# Patient Record
Sex: Male | Born: 1945 | Race: Black or African American | Hispanic: No | Marital: Single | Smoking: Current every day smoker
Health system: Southern US, Community
[De-identification: ages and names within clinical notes are randomized; demographics above are authoritative.]

## PROBLEM LIST (undated history)

## (undated) DIAGNOSIS — Z923 Personal history of irradiation: Secondary | ICD-10-CM

## (undated) DIAGNOSIS — C801 Malignant (primary) neoplasm, unspecified: Secondary | ICD-10-CM

## (undated) DIAGNOSIS — F1010 Alcohol abuse, uncomplicated: Secondary | ICD-10-CM

## (undated) DIAGNOSIS — K219 Gastro-esophageal reflux disease without esophagitis: Secondary | ICD-10-CM

## (undated) DIAGNOSIS — I10 Essential (primary) hypertension: Secondary | ICD-10-CM

## (undated) DIAGNOSIS — R630 Anorexia: Secondary | ICD-10-CM

## (undated) DIAGNOSIS — C3491 Malignant neoplasm of unspecified part of right bronchus or lung: Secondary | ICD-10-CM

## (undated) DIAGNOSIS — I219 Acute myocardial infarction, unspecified: Secondary | ICD-10-CM

## (undated) DIAGNOSIS — R42 Dizziness and giddiness: Secondary | ICD-10-CM

## (undated) DIAGNOSIS — Z9221 Personal history of antineoplastic chemotherapy: Secondary | ICD-10-CM

## (undated) HISTORY — PX: GANGLION CYST EXCISION: SHX1691

## (undated) HISTORY — DX: Personal history of antineoplastic chemotherapy: Z92.21

## (undated) HISTORY — PX: OTHER SURGICAL HISTORY: SHX169

## (undated) HISTORY — DX: Dizziness and giddiness: R42

## (undated) HISTORY — PX: APPENDECTOMY: SHX54

## (undated) HISTORY — DX: Personal history of irradiation: Z92.3

## (undated) HISTORY — PX: HERNIA REPAIR: SHX51

## (undated) HISTORY — DX: Acute myocardial infarction, unspecified: I21.9

## (undated) HISTORY — DX: Malignant neoplasm of unspecified part of right bronchus or lung: C34.91

## (undated) HISTORY — DX: Anorexia: R63.0

---

## 1980-05-08 DIAGNOSIS — I219 Acute myocardial infarction, unspecified: Secondary | ICD-10-CM

## 1980-05-08 HISTORY — DX: Acute myocardial infarction, unspecified: I21.9

## 2006-12-18 ENCOUNTER — Inpatient Hospital Stay: Payer: Self-pay | Admitting: Internal Medicine

## 2006-12-18 ENCOUNTER — Other Ambulatory Visit: Payer: Self-pay

## 2011-01-16 ENCOUNTER — Ambulatory Visit: Payer: Self-pay | Admitting: Internal Medicine

## 2011-09-29 DIAGNOSIS — C801 Malignant (primary) neoplasm, unspecified: Secondary | ICD-10-CM

## 2011-09-29 HISTORY — DX: Malignant (primary) neoplasm, unspecified: C80.1

## 2011-11-17 ENCOUNTER — Ambulatory Visit: Payer: Self-pay | Admitting: Internal Medicine

## 2011-11-17 LAB — COMPREHENSIVE METABOLIC PANEL
Albumin: 3.6 g/dL (ref 3.4–5.0)
Alkaline Phosphatase: 81 U/L (ref 50–136)
Anion Gap: 6 — ABNORMAL LOW (ref 7–16)
Calcium, Total: 9.3 mg/dL (ref 8.5–10.1)
Co2: 32 mmol/L (ref 21–32)
Creatinine: 1.21 mg/dL (ref 0.60–1.30)
EGFR (Non-African Amer.): >60
Glucose: 135 mg/dL — ABNORMAL HIGH (ref 65–99)
Osmolality: 277 (ref 275–301)
SGOT(AST): 18 U/L (ref 15–37)
SGPT (ALT): 16 U/L

## 2011-11-17 LAB — CBC CANCER CENTER
Basophil %: 1 %
Eosinophil %: 1.6 %
HCT: 41.9 % (ref 40.0–52.0)
HGB: 14.1 g/dL (ref 13.0–18.0)
Lymphocyte %: 23.6 %
Monocyte #: 0.6 x10 3/mm (ref 0.2–1.0)
Neutrophil #: 4 x10 3/mm (ref 1.4–6.5)
Neutrophil %: 64.1 %
Platelet: 206 x10 3/mm (ref 150–440)
RDW: 14.2 % (ref 11.5–14.5)
WBC: 6.2 x10 3/mm (ref 3.8–10.6)

## 2011-11-17 LAB — APTT: Activated PTT: 33.2 secs (ref 23.6–35.9)

## 2011-11-17 LAB — PROTIME-INR
INR: 0.9
Prothrombin Time: 13 secs (ref 11.5–14.7)

## 2011-11-20 ENCOUNTER — Ambulatory Visit: Payer: Self-pay | Admitting: Internal Medicine

## 2011-11-22 LAB — CBC CANCER CENTER
Basophil #: 0.1 x10 3/mm (ref 0.0–0.1)
Basophil %: 3.5 %
Eosinophil #: 0.2 x10 3/mm (ref 0.0–0.7)
HGB: 13.4 g/dL (ref 13.0–18.0)
Lymphocyte %: 33.6 %
MCHC: 32.9 g/dL (ref 32.0–36.0)
MCV: 104 fL — ABNORMAL HIGH (ref 80–100)
Monocyte %: 13.2 %
Neutrophil %: 45.1 %

## 2011-11-22 LAB — BASIC METABOLIC PANEL
BUN: 7 mg/dL (ref 7–18)
Calcium, Total: 8.9 mg/dL (ref 8.5–10.1)
Creatinine: 1.15 mg/dL (ref 0.60–1.30)
EGFR (Non-African Amer.): >60
Glucose: 79 mg/dL (ref 65–99)
Osmolality: 282 (ref 275–301)

## 2011-11-22 LAB — MAGNESIUM: Magnesium: 1.9 mg/dL

## 2011-11-23 ENCOUNTER — Observation Stay: Payer: Self-pay | Admitting: Internal Medicine

## 2011-11-24 ENCOUNTER — Inpatient Hospital Stay: Payer: Self-pay | Admitting: Internal Medicine

## 2011-11-25 LAB — BASIC METABOLIC PANEL
Anion Gap: 10 (ref 7–16)
Calcium, Total: 8.2 mg/dL — ABNORMAL LOW (ref 8.5–10.1)
Co2: 23 mmol/L (ref 21–32)
EGFR (African American): >60
Glucose: 147 mg/dL — ABNORMAL HIGH (ref 65–99)
Potassium: 4.2 mmol/L (ref 3.5–5.1)
Sodium: 137 mmol/L (ref 136–145)

## 2011-11-28 LAB — CBC WITH DIFFERENTIAL/PLATELET
Basophil #: 0 10*3/uL (ref 0.0–0.1)
Basophil %: 0.4 %
Eosinophil %: 0.8 %
HGB: 12.8 g/dL — ABNORMAL LOW (ref 13.0–18.0)
Lymphocyte #: 0.9 10*3/uL — ABNORMAL LOW (ref 1.0–3.6)
MCH: 34.1 pg — ABNORMAL HIGH (ref 26.0–34.0)
MCHC: 32.9 g/dL (ref 32.0–36.0)
MCV: 104 fL — ABNORMAL HIGH (ref 80–100)
Monocyte #: 0.1 x10 3/mm — ABNORMAL LOW (ref 0.2–1.0)
RBC: 3.74 10*6/uL — ABNORMAL LOW (ref 4.40–5.90)

## 2011-11-28 LAB — HEPATIC FUNCTION PANEL A (ARMC)
Albumin: 2.6 g/dL — ABNORMAL LOW (ref 3.4–5.0)
Bilirubin, Direct: 0.1 mg/dL (ref 0.00–0.20)
Bilirubin,Total: 0.5 mg/dL (ref 0.2–1.0)
SGOT(AST): 11 U/L — ABNORMAL LOW (ref 15–37)

## 2011-11-28 LAB — BASIC METABOLIC PANEL
Anion Gap: 10 (ref 7–16)
Calcium, Total: 7.9 mg/dL — ABNORMAL LOW (ref 8.5–10.1)
Chloride: 99 mmol/L (ref 98–107)
Co2: 29 mmol/L (ref 21–32)
EGFR (Non-African Amer.): >60
Osmolality: 276 (ref 275–301)

## 2011-12-01 LAB — CBC WITH DIFFERENTIAL/PLATELET
Basophil #: 0 x10 3/mm 3
Basophil %: 1.3 %
Eosinophil #: 0 x10 3/mm 3
Eosinophil %: 2.9 %
HCT: 35.3 % — ABNORMAL LOW
HGB: 12.4 g/dL — ABNORMAL LOW
Lymphocyte %: 82.4 %
Lymphs Abs: 0.4 x10 3/mm 3 — ABNORMAL LOW
MCH: 35.6 pg — ABNORMAL HIGH
MCHC: 35.2 g/dL
MCV: 101 fL — ABNORMAL HIGH
Monocyte #: 0 "x10 3/mm " — ABNORMAL LOW
Monocyte %: 3.6 %
Neutrophil #: 0.1 x10 3/mm 3 — ABNORMAL LOW
Neutrophil %: 9.8 %
Platelet: 76 x10 3/mm 3 — ABNORMAL LOW
RBC: 3.49 x10 6/mm 3 — ABNORMAL LOW
RDW: 13.8 %
WBC: 0.5 x10 3/mm 3 — CL

## 2011-12-01 LAB — BASIC METABOLIC PANEL
Anion Gap: 7 (ref 7–16)
Calcium, Total: 8.1 mg/dL — ABNORMAL LOW (ref 8.5–10.1)
Chloride: 99 mmol/L (ref 98–107)
Co2: 29 mmol/L (ref 21–32)
EGFR (African American): >60
EGFR (Non-African Amer.): >60
Glucose: 97 mg/dL (ref 65–99)
Osmolality: 270 (ref 275–301)
Sodium: 135 mmol/L — ABNORMAL LOW (ref 136–145)

## 2011-12-02 LAB — CBC WITH DIFFERENTIAL/PLATELET
Eosinophil #: 0 10*3/uL (ref 0.0–0.7)
Eosinophil %: 2.4 %
HCT: 35.5 % — ABNORMAL LOW (ref 40.0–52.0)
HGB: 12.5 g/dL — ABNORMAL LOW (ref 13.0–18.0)
Lymphocyte #: 0.5 10*3/uL — ABNORMAL LOW (ref 1.0–3.6)
Lymphocyte %: 73.7 %
MCH: 35.7 pg — ABNORMAL HIGH (ref 26.0–34.0)
MCV: 101 fL — ABNORMAL HIGH (ref 80–100)
Monocyte #: 0.1 x10 3/mm — ABNORMAL LOW (ref 0.2–1.0)
Platelet: 84 10*3/uL — ABNORMAL LOW (ref 150–440)
RBC: 3.52 10*6/uL — ABNORMAL LOW (ref 4.40–5.90)
RDW: 13.5 % (ref 11.5–14.5)
WBC: 0.7 10*3/uL — CL (ref 3.8–10.6)

## 2011-12-02 LAB — POTASSIUM: Potassium: 3.4 mmol/L — ABNORMAL LOW (ref 3.5–5.1)

## 2011-12-03 LAB — BASIC METABOLIC PANEL
Calcium, Total: 8 mg/dL — ABNORMAL LOW (ref 8.5–10.1)
Co2: 28 mmol/L (ref 21–32)
Creatinine: 1.65 mg/dL — ABNORMAL HIGH (ref 0.60–1.30)
EGFR (African American): 49 — ABNORMAL LOW
EGFR (Non-African Amer.): 55 — ABNORMAL LOW
EGFR (Non-African Amer.): 42 — ABNORMAL LOW
Glucose: 108 mg/dL — ABNORMAL HIGH (ref 65–99)
Glucose: 142 mg/dL — ABNORMAL HIGH (ref 65–99)
Osmolality: 267 (ref 275–301)
Osmolality: 268 (ref 275–301)
Potassium: 3 mmol/L — ABNORMAL LOW (ref 3.5–5.1)
Potassium: 3 mmol/L — ABNORMAL LOW (ref 3.5–5.1)
Sodium: 132 mmol/L — ABNORMAL LOW (ref 136–145)
Sodium: 131 mmol/L — ABNORMAL LOW (ref 136–145)

## 2011-12-03 LAB — CBC WITH DIFFERENTIAL/PLATELET
Basophil #: 0 10*3/uL (ref 0.0–0.1)
Basophil %: 0.8 %
Eosinophil %: 1.6 %
HGB: 12.2 g/dL — ABNORMAL LOW (ref 13.0–18.0)
Monocyte #: 0.4 x10 3/mm (ref 0.2–1.0)
Monocyte %: 39.5 %
Neutrophil %: 4.9 %
Platelet: 86 10*3/uL — ABNORMAL LOW (ref 150–440)
RBC: 3.41 10*6/uL — ABNORMAL LOW (ref 4.40–5.90)
RDW: 13.7 % (ref 11.5–14.5)
WBC: 1 10*3/uL — CL (ref 3.8–10.6)

## 2011-12-04 LAB — CREATININE, SERUM
EGFR (African American): 43 — ABNORMAL LOW
EGFR (Non-African Amer.): 37 — ABNORMAL LOW

## 2011-12-04 LAB — CBC WITH DIFFERENTIAL/PLATELET
Basophil #: 0 10*3/uL (ref 0.0–0.1)
Basophil %: 0.3 %
Eosinophil %: 0.7 %
Lymphocyte #: 0.6 10*3/uL — ABNORMAL LOW (ref 1.0–3.6)
Lymphocyte %: 24.8 %
MCH: 35.7 pg — ABNORMAL HIGH (ref 26.0–34.0)
MCHC: 36.1 g/dL — ABNORMAL HIGH (ref 32.0–36.0)
MCV: 99 fL (ref 80–100)
Neutrophil #: 1 10*3/uL — ABNORMAL LOW (ref 1.4–6.5)
Platelet: 106 10*3/uL — ABNORMAL LOW (ref 150–440)
RDW: 13.6 % (ref 11.5–14.5)

## 2011-12-04 LAB — BASIC METABOLIC PANEL
BUN: 25 mg/dL — ABNORMAL HIGH (ref 7–18)
Creatinine: 1.81 mg/dL — ABNORMAL HIGH (ref 0.60–1.30)
EGFR (African American): 44 — ABNORMAL LOW
EGFR (Non-African Amer.): 38 — ABNORMAL LOW
Glucose: 119 mg/dL — ABNORMAL HIGH (ref 65–99)
Osmolality: 266 (ref 275–301)
Potassium: 3.2 mmol/L — ABNORMAL LOW (ref 3.5–5.1)
Sodium: 130 mmol/L — ABNORMAL LOW (ref 136–145)

## 2011-12-04 LAB — VANCOMYCIN, TROUGH: Vancomycin, Trough: 17 ug/mL (ref 10–20)

## 2011-12-05 LAB — CBC WITH DIFFERENTIAL/PLATELET
Basophil #: 0 10*3/uL (ref 0.0–0.1)
Eosinophil #: 0 10*3/uL (ref 0.0–0.7)
Eosinophil %: 0.1 %
HCT: 32 % — ABNORMAL LOW (ref 40.0–52.0)
HGB: 11.4 g/dL — ABNORMAL LOW (ref 13.0–18.0)
Lymphocyte #: 0.7 10*3/uL — ABNORMAL LOW (ref 1.0–3.6)
Lymphocyte %: 12.9 %
MCHC: 35.6 g/dL (ref 32.0–36.0)
MCV: 100 fL (ref 80–100)
Monocyte %: 8.9 %
Neutrophil #: 4.2 10*3/uL (ref 1.4–6.5)
Neutrophil %: 77.9 %
Platelet: 114 10*3/uL — ABNORMAL LOW (ref 150–440)
RBC: 3.2 10*6/uL — ABNORMAL LOW (ref 4.40–5.90)
RDW: 13.8 % (ref 11.5–14.5)
WBC: 5.3 10*3/uL (ref 3.8–10.6)

## 2011-12-05 LAB — MAGNESIUM: Magnesium: 2 mg/dL

## 2011-12-05 LAB — CREATININE, SERUM
Creatinine: 2.15 mg/dL — ABNORMAL HIGH (ref 0.60–1.30)
EGFR (African American): 36 — ABNORMAL LOW

## 2011-12-06 LAB — BASIC METABOLIC PANEL
Anion Gap: 9 (ref 7–16)
Calcium, Total: 7.9 mg/dL — ABNORMAL LOW (ref 8.5–10.1)
Chloride: 99 mmol/L (ref 98–107)
Co2: 29 mmol/L (ref 21–32)
EGFR (African American): 33 — ABNORMAL LOW
EGFR (Non-African Amer.): 29 — ABNORMAL LOW
Glucose: 137 mg/dL — ABNORMAL HIGH (ref 65–99)
Sodium: 137 mmol/L (ref 136–145)

## 2011-12-06 LAB — CBC WITH DIFFERENTIAL/PLATELET
Basophil #: 0.2 10*3/uL — ABNORMAL HIGH (ref 0.0–0.1)
Basophil %: 0.2 %
Lymphocyte %: 4 %
MCH: 35.2 pg — ABNORMAL HIGH (ref 26.0–34.0)
Monocyte %: 8.9 %
Neutrophil %: 86.9 %
Platelet: 142 10*3/uL — ABNORMAL LOW (ref 150–440)
RDW: 14 % (ref 11.5–14.5)
WBC: 14.4 10*3/uL — ABNORMAL HIGH (ref 3.8–10.6)

## 2011-12-06 LAB — URINE CULTURE

## 2011-12-07 ENCOUNTER — Ambulatory Visit: Payer: Self-pay | Admitting: Internal Medicine

## 2011-12-07 LAB — CULTURE, BLOOD (SINGLE)

## 2011-12-15 LAB — COMPREHENSIVE METABOLIC PANEL
Alkaline Phosphatase: 93 U/L (ref 50–136)
BUN: 21 mg/dL — ABNORMAL HIGH (ref 7–18)
Bilirubin,Total: 0.2 mg/dL (ref 0.2–1.0)
Calcium, Total: 8.6 mg/dL (ref 8.5–10.1)
Chloride: 103 mmol/L (ref 98–107)
Creatinine: 1.57 mg/dL — ABNORMAL HIGH (ref 0.60–1.30)
EGFR (African American): 52 — ABNORMAL LOW
Glucose: 87 mg/dL (ref 65–99)
Osmolality: 285 (ref 275–301)
Potassium: 3.5 mmol/L (ref 3.5–5.1)
SGPT (ALT): 10 U/L — ABNORMAL LOW (ref 12–78)
Sodium: 142 mmol/L (ref 136–145)
Total Protein: 7 g/dL (ref 6.4–8.2)

## 2011-12-15 LAB — CBC CANCER CENTER
Basophil %: 0.4 %
Eosinophil #: 0.1 x10 3/mm (ref 0.0–0.7)
HCT: 32.8 % — ABNORMAL LOW (ref 40.0–52.0)
HGB: 11.1 g/dL — ABNORMAL LOW (ref 13.0–18.0)
Lymphocyte #: 1.6 x10 3/mm (ref 1.0–3.6)
Lymphocyte %: 11.4 %
MCH: 34.9 pg — ABNORMAL HIGH (ref 26.0–34.0)
MCHC: 33.8 g/dL (ref 32.0–36.0)
MCV: 103 fL — ABNORMAL HIGH (ref 80–100)
Monocyte #: 1.2 x10 3/mm — ABNORMAL HIGH (ref 0.2–1.0)
Neutrophil #: 11.4 x10 3/mm — ABNORMAL HIGH (ref 1.4–6.5)
Platelet: 332 x10 3/mm (ref 150–440)
WBC: 14.3 x10 3/mm — ABNORMAL HIGH (ref 3.8–10.6)

## 2011-12-21 ENCOUNTER — Ambulatory Visit: Payer: Self-pay | Admitting: Vascular Surgery

## 2011-12-22 LAB — COMPREHENSIVE METABOLIC PANEL
Alkaline Phosphatase: 100 U/L (ref 50–136)
Anion Gap: 6 — ABNORMAL LOW (ref 7–16)
Calcium, Total: 8.8 mg/dL (ref 8.5–10.1)
Chloride: 100 mmol/L (ref 98–107)
Co2: 33 mmol/L — ABNORMAL HIGH (ref 21–32)
EGFR (African American): >60
Osmolality: 280 (ref 275–301)
SGPT (ALT): 13 U/L (ref 12–78)
Sodium: 139 mmol/L (ref 136–145)

## 2011-12-22 LAB — CBC CANCER CENTER
Basophil %: 0.9 %
Eosinophil %: 2 %
HCT: 32.2 % — ABNORMAL LOW (ref 40.0–52.0)
MCHC: 33.1 g/dL (ref 32.0–36.0)
Monocyte %: 12.6 %
Neutrophil #: 6.8 x10 3/mm — ABNORMAL HIGH (ref 1.4–6.5)
Neutrophil %: 69.9 %
Platelet: 243 x10 3/mm (ref 150–440)
RBC: 3.15 10*6/uL — ABNORMAL LOW (ref 4.40–5.90)

## 2011-12-26 LAB — BASIC METABOLIC PANEL
Anion Gap: 5 — ABNORMAL LOW (ref 7–16)
BUN: 28 mg/dL — ABNORMAL HIGH (ref 7–18)
Calcium, Total: 8.9 mg/dL (ref 8.5–10.1)
Chloride: 100 mmol/L (ref 98–107)
Creatinine: 1.2 mg/dL (ref 0.60–1.30)
EGFR (Non-African Amer.): >60
Glucose: 97 mg/dL (ref 65–99)
Osmolality: 285 (ref 275–301)
Sodium: 140 mmol/L (ref 136–145)

## 2012-01-01 LAB — BASIC METABOLIC PANEL
Anion Gap: 7 (ref 7–16)
Calcium, Total: 9.4 mg/dL (ref 8.5–10.1)
Chloride: 102 mmol/L (ref 98–107)
Co2: 32 mmol/L (ref 21–32)
Creatinine: 1.3 mg/dL (ref 0.60–1.30)
EGFR (African American): >60
EGFR (Non-African Amer.): 56 — ABNORMAL LOW
Glucose: 88 mg/dL (ref 65–99)
Osmolality: 284 (ref 275–301)
Sodium: 141 mmol/L (ref 136–145)

## 2012-01-01 LAB — CBC CANCER CENTER
Basophil #: 0.1 x10 3/mm (ref 0.0–0.1)
Eosinophil #: 0.1 x10 3/mm (ref 0.0–0.7)
HGB: 11 g/dL — ABNORMAL LOW (ref 13.0–18.0)
Lymphocyte %: 12.2 %
MCH: 33.3 pg (ref 26.0–34.0)
MCHC: 32.9 g/dL (ref 32.0–36.0)
MCV: 101 fL — ABNORMAL HIGH (ref 80–100)
Monocyte #: 1.4 x10 3/mm — ABNORMAL HIGH (ref 0.2–1.0)
Neutrophil %: 76.1 %
Platelet: 202 x10 3/mm (ref 150–440)
RDW: 14.1 % (ref 11.5–14.5)

## 2012-01-07 ENCOUNTER — Ambulatory Visit: Payer: Self-pay | Admitting: Internal Medicine

## 2012-01-10 LAB — CBC CANCER CENTER
Eosinophil %: 0.5 %
Lymphocyte %: 16.6 %
Monocyte %: 5.4 %
Neutrophil %: 77.1 %
Platelet: 116 x10 3/mm — ABNORMAL LOW (ref 150–440)

## 2012-01-10 LAB — HEPATIC FUNCTION PANEL A (ARMC)
Albumin: 3.6 g/dL (ref 3.4–5.0)
Alkaline Phosphatase: 111 U/L (ref 50–136)
Bilirubin,Total: 0.4 mg/dL (ref 0.2–1.0)
SGOT(AST): 30 U/L (ref 15–37)
SGPT (ALT): 31 U/L (ref 12–78)

## 2012-01-10 LAB — BASIC METABOLIC PANEL
Calcium, Total: 9.3 mg/dL (ref 8.5–10.1)
Co2: 31 mmol/L (ref 21–32)
EGFR (Non-African Amer.): 47 — ABNORMAL LOW
Glucose: 81 mg/dL (ref 65–99)

## 2012-01-30 LAB — CBC CANCER CENTER
Basophil #: 0.1 x10 3/mm (ref 0.0–0.1)
Eosinophil #: 0.1 x10 3/mm (ref 0.0–0.7)
Eosinophil %: 1.9 %
HGB: 11.6 g/dL — ABNORMAL LOW (ref 13.0–18.0)
Lymphocyte #: 1.8 x10 3/mm (ref 1.0–3.6)
MCH: 35.5 pg — ABNORMAL HIGH (ref 26.0–34.0)
MCHC: 33.4 g/dL (ref 32.0–36.0)
Monocyte #: 0.9 x10 3/mm (ref 0.2–1.0)
Monocyte %: 12.9 %
Neutrophil #: 3.8 x10 3/mm (ref 1.4–6.5)
Neutrophil %: 57 %
Platelet: 225 x10 3/mm (ref 150–440)
RBC: 3.28 10*6/uL — ABNORMAL LOW (ref 4.40–5.90)
RDW: 19.7 % — ABNORMAL HIGH (ref 11.5–14.5)
WBC: 6.6 x10 3/mm (ref 3.8–10.6)

## 2012-01-30 LAB — BASIC METABOLIC PANEL
Anion Gap: 7 (ref 7–16)
BUN: 12 mg/dL (ref 7–18)
Calcium, Total: 9.6 mg/dL (ref 8.5–10.1)
Chloride: 101 mmol/L (ref 98–107)
Co2: 34 mmol/L — ABNORMAL HIGH (ref 21–32)
Creatinine: 1.29 mg/dL (ref 0.60–1.30)
EGFR (African American): >60
Osmolality: 282 (ref 275–301)
Potassium: 3.3 mmol/L — ABNORMAL LOW (ref 3.5–5.1)

## 2012-02-06 ENCOUNTER — Ambulatory Visit: Payer: Self-pay | Admitting: Internal Medicine

## 2012-02-06 LAB — BASIC METABOLIC PANEL
Anion Gap: 10 (ref 7–16)
Calcium, Total: 9 mg/dL (ref 8.5–10.1)
Chloride: 105 mmol/L (ref 98–107)
Co2: 27 mmol/L (ref 21–32)
Osmolality: 283 (ref 275–301)
Potassium: 3.3 mmol/L — ABNORMAL LOW (ref 3.5–5.1)

## 2012-02-06 LAB — CBC CANCER CENTER
Basophil #: 0.1 x10 3/mm (ref 0.0–0.1)
Eosinophil #: 0.1 x10 3/mm (ref 0.0–0.7)
HGB: 11.3 g/dL — ABNORMAL LOW (ref 13.0–18.0)
Lymphocyte #: 1.1 x10 3/mm (ref 1.0–3.6)
Lymphocyte %: 19.2 %
MCH: 35 pg — ABNORMAL HIGH (ref 26.0–34.0)
MCHC: 32.5 g/dL (ref 32.0–36.0)
Neutrophil #: 3.7 x10 3/mm (ref 1.4–6.5)
Neutrophil %: 61.6 %
Platelet: 222 x10 3/mm (ref 150–440)
WBC: 5.9 x10 3/mm (ref 3.8–10.6)

## 2012-02-06 LAB — MAGNESIUM: Magnesium: 2 mg/dL

## 2012-02-13 LAB — CBC CANCER CENTER
Basophil #: 0.1 x10 3/mm (ref 0.0–0.1)
HCT: 33.6 % — ABNORMAL LOW (ref 40.0–52.0)
Lymphocyte #: 0.8 x10 3/mm — ABNORMAL LOW (ref 1.0–3.6)
MCH: 36.1 pg — ABNORMAL HIGH (ref 26.0–34.0)
MCHC: 33.3 g/dL (ref 32.0–36.0)
MCV: 108 fL — ABNORMAL HIGH (ref 80–100)
Monocyte #: 0.5 x10 3/mm (ref 0.2–1.0)
Monocyte %: 9.5 %
Neutrophil #: 3.4 x10 3/mm (ref 1.4–6.5)
Platelet: 169 x10 3/mm (ref 150–440)
RDW: 19.4 % — ABNORMAL HIGH (ref 11.5–14.5)
WBC: 5 x10 3/mm (ref 3.8–10.6)

## 2012-02-13 LAB — BASIC METABOLIC PANEL
Anion Gap: 10 (ref 7–16)
BUN: 10 mg/dL (ref 7–18)
Calcium, Total: 8.6 mg/dL (ref 8.5–10.1)
Co2: 27 mmol/L (ref 21–32)
Creatinine: 1 mg/dL (ref 0.60–1.30)
EGFR (African American): >60
EGFR (Non-African Amer.): >60
Glucose: 90 mg/dL (ref 65–99)
Sodium: 143 mmol/L (ref 136–145)

## 2012-02-13 LAB — MAGNESIUM: Magnesium: 1.9 mg/dL

## 2012-02-20 LAB — BASIC METABOLIC PANEL
Anion Gap: 15 (ref 7–16)
BUN: 15 mg/dL (ref 7–18)
Calcium, Total: 8.6 mg/dL (ref 8.5–10.1)
Co2: 26 mmol/L (ref 21–32)
Creatinine: 1.1 mg/dL (ref 0.60–1.30)
Glucose: 72 mg/dL (ref 65–99)
Osmolality: 281 (ref 275–301)
Sodium: 141 mmol/L (ref 136–145)

## 2012-02-20 LAB — CBC CANCER CENTER
Basophil #: 0.1 x10 3/mm (ref 0.0–0.1)
Eosinophil #: 0.1 x10 3/mm (ref 0.0–0.7)
Eosinophil %: 1.2 %
HCT: 35 % — ABNORMAL LOW (ref 40.0–52.0)
Lymphocyte #: 0.6 x10 3/mm — ABNORMAL LOW (ref 1.0–3.6)
Lymphocyte %: 10.9 %
MCHC: 32.9 g/dL (ref 32.0–36.0)
MCV: 110 fL — ABNORMAL HIGH (ref 80–100)
Monocyte #: 0.4 x10 3/mm (ref 0.2–1.0)
Monocyte %: 7.2 %
Neutrophil #: 4.4 x10 3/mm (ref 1.4–6.5)
Neutrophil %: 79.7 %
Platelet: 143 x10 3/mm — ABNORMAL LOW (ref 150–440)
RBC: 3.19 10*6/uL — ABNORMAL LOW (ref 4.40–5.90)
RDW: 19.1 % — ABNORMAL HIGH (ref 11.5–14.5)
WBC: 5.5 x10 3/mm (ref 3.8–10.6)

## 2012-02-20 LAB — MAGNESIUM: Magnesium: 1.6 mg/dL — ABNORMAL LOW

## 2012-02-27 LAB — CBC CANCER CENTER
Basophil #: 0 x10 3/mm (ref 0.0–0.1)
Basophil %: 1.1 %
Eosinophil #: 0 x10 3/mm (ref 0.0–0.7)
Eosinophil %: 0.7 %
HCT: 34.8 % — ABNORMAL LOW (ref 40.0–52.0)
Lymphocyte #: 0.5 x10 3/mm — ABNORMAL LOW (ref 1.0–3.6)
Lymphocyte %: 12.5 %
MCV: 111 fL — ABNORMAL HIGH (ref 80–100)
Monocyte #: 0.4 x10 3/mm (ref 0.2–1.0)
Monocyte %: 10.9 %
Neutrophil %: 74.8 %
Platelet: 123 x10 3/mm — ABNORMAL LOW (ref 150–440)
RBC: 3.14 10*6/uL — ABNORMAL LOW (ref 4.40–5.90)
RDW: 17.9 % — ABNORMAL HIGH (ref 11.5–14.5)
WBC: 4 x10 3/mm (ref 3.8–10.6)

## 2012-02-27 LAB — BASIC METABOLIC PANEL
Anion Gap: 14 (ref 7–16)
BUN: 16 mg/dL (ref 7–18)
Co2: 27 mmol/L (ref 21–32)
Creatinine: 1.1 mg/dL (ref 0.60–1.30)
EGFR (African American): >60

## 2012-03-05 LAB — CBC CANCER CENTER
Basophil %: 0.8 %
Eosinophil %: 0.9 %
Lymphocyte #: 0.4 x10 3/mm — ABNORMAL LOW (ref 1.0–3.6)
Lymphocyte %: 15.6 %
MCH: 38.1 pg — ABNORMAL HIGH (ref 26.0–34.0)
MCV: 113 fL — ABNORMAL HIGH (ref 80–100)
Monocyte #: 0.4 x10 3/mm (ref 0.2–1.0)
Monocyte %: 14.7 %
Neutrophil %: 68 %
RBC: 2.76 10*6/uL — ABNORMAL LOW (ref 4.40–5.90)

## 2012-03-05 LAB — BASIC METABOLIC PANEL
Anion Gap: 11 (ref 7–16)
BUN: 24 mg/dL — ABNORMAL HIGH (ref 7–18)
Calcium, Total: 9.2 mg/dL (ref 8.5–10.1)
Chloride: 103 mmol/L (ref 98–107)
Co2: 29 mmol/L (ref 21–32)
Creatinine: 1.49 mg/dL — ABNORMAL HIGH (ref 0.60–1.30)
EGFR (African American): 55 — ABNORMAL LOW
Potassium: 3.3 mmol/L — ABNORMAL LOW (ref 3.5–5.1)

## 2012-03-05 LAB — MAGNESIUM: Magnesium: 1.7 mg/dL — ABNORMAL LOW

## 2012-03-08 ENCOUNTER — Ambulatory Visit: Payer: Self-pay | Admitting: Internal Medicine

## 2012-03-12 LAB — CBC CANCER CENTER
Basophil #: 0 x10 3/mm (ref 0.0–0.1)
Eosinophil %: 0.4 %
Lymphocyte #: 0.4 x10 3/mm — ABNORMAL LOW (ref 1.0–3.6)
Lymphocyte %: 16.3 %
MCH: 38.4 pg — ABNORMAL HIGH (ref 26.0–34.0)
MCHC: 33.8 g/dL (ref 32.0–36.0)
MCV: 114 fL — ABNORMAL HIGH (ref 80–100)
Neutrophil #: 1.5 x10 3/mm (ref 1.4–6.5)
Platelet: 138 x10 3/mm — ABNORMAL LOW (ref 150–440)
RDW: 17 % — ABNORMAL HIGH (ref 11.5–14.5)

## 2012-03-12 LAB — BASIC METABOLIC PANEL
Calcium, Total: 9.5 mg/dL (ref 8.5–10.1)
Chloride: 101 mmol/L (ref 98–107)
Co2: 27 mmol/L (ref 21–32)
Creatinine: 1.32 mg/dL — ABNORMAL HIGH (ref 0.60–1.30)
EGFR (African American): >60
Osmolality: 279 (ref 275–301)
Potassium: 3.8 mmol/L (ref 3.5–5.1)
Sodium: 139 mmol/L (ref 136–145)

## 2012-03-18 ENCOUNTER — Emergency Department: Payer: Self-pay | Admitting: Emergency Medicine

## 2012-03-18 LAB — TROPONIN I: Troponin-I: <0.02 ng/mL

## 2012-03-18 LAB — URINALYSIS, COMPLETE
Glucose,UR: NEGATIVE mg/dL (ref 0–75)
Leukocyte Esterase: NEGATIVE
Nitrite: NEGATIVE
Ph: 5 (ref 4.5–8.0)
RBC,UR: <1 /HPF (ref 0–5)
Squamous Epithelial: 2
WBC UR: <1 /HPF (ref 0–5)

## 2012-03-18 LAB — DIFFERENTIAL
Basophil %: 0.2 %
Eosinophil #: 0 10*3/uL (ref 0.0–0.7)
Eosinophil %: 0.1 %
Lymphocyte #: 0.1 10*3/uL — ABNORMAL LOW (ref 1.0–3.6)
Monocyte #: 0.3 x10 3/mm (ref 0.2–1.0)

## 2012-03-18 LAB — CBC
MCHC: 35.2 g/dL (ref 32.0–36.0)
MCV: 112 fL — ABNORMAL HIGH (ref 80–100)
Platelet: 130 10*3/uL — ABNORMAL LOW (ref 150–440)
RDW: 16.6 % — ABNORMAL HIGH (ref 11.5–14.5)
WBC: 2.1 10*3/uL — ABNORMAL LOW (ref 3.8–10.6)

## 2012-03-18 LAB — COMPREHENSIVE METABOLIC PANEL
Albumin: 3.4 g/dL (ref 3.4–5.0)
Anion Gap: 14 (ref 7–16)
BUN: 33 mg/dL — ABNORMAL HIGH (ref 7–18)
Bilirubin,Total: 0.9 mg/dL (ref 0.2–1.0)
Co2: 25 mmol/L (ref 21–32)
Creatinine: 1.2 mg/dL (ref 0.60–1.30)
EGFR (Non-African Amer.): >60
Glucose: 114 mg/dL — ABNORMAL HIGH (ref 65–99)
Osmolality: 276 (ref 275–301)
Potassium: 4 mmol/L (ref 3.5–5.1)
SGPT (ALT): 22 U/L (ref 12–78)
Sodium: 134 mmol/L — ABNORMAL LOW (ref 136–145)
Total Protein: 7.6 g/dL (ref 6.4–8.2)

## 2012-03-18 LAB — LIPASE, BLOOD: Lipase: 78 U/L (ref 73–393)

## 2012-03-20 LAB — CBC WITH DIFFERENTIAL/PLATELET
Basophil #: 0 10*3/uL (ref 0.0–0.1)
Eosinophil #: 0 10*3/uL (ref 0.0–0.7)
Eosinophil %: 0.4 %
HCT: 33.1 % — ABNORMAL LOW (ref 40.0–52.0)
Lymphocyte #: 0.2 10*3/uL — ABNORMAL LOW (ref 1.0–3.6)
MCHC: 35.6 g/dL (ref 32.0–36.0)
MCV: 113 fL — ABNORMAL HIGH (ref 80–100)
Monocyte %: 14.9 %
Neutrophil %: 73.2 %
Platelet: 123 10*3/uL — ABNORMAL LOW (ref 150–440)
RBC: 2.93 10*6/uL — ABNORMAL LOW (ref 4.40–5.90)
RDW: 16.4 % — ABNORMAL HIGH (ref 11.5–14.5)
WBC: 2.1 10*3/uL — ABNORMAL LOW (ref 3.8–10.6)

## 2012-03-20 LAB — COMPREHENSIVE METABOLIC PANEL
Albumin: 3.3 g/dL — ABNORMAL LOW (ref 3.4–5.0)
Alkaline Phosphatase: 46 U/L — ABNORMAL LOW (ref 50–136)
BUN: 32 mg/dL — ABNORMAL HIGH (ref 7–18)
Glucose: 82 mg/dL (ref 65–99)
SGOT(AST): 25 U/L (ref 15–37)
SGPT (ALT): 21 U/L (ref 12–78)
Total Protein: 6.9 g/dL (ref 6.4–8.2)

## 2012-03-20 LAB — LIPASE, BLOOD: Lipase: 101 U/L (ref 73–393)

## 2012-03-21 ENCOUNTER — Inpatient Hospital Stay: Payer: Self-pay | Admitting: Internal Medicine

## 2012-03-21 LAB — CBC WITH DIFFERENTIAL/PLATELET
Basophil #: 0 10*3/uL (ref 0.0–0.1)
Basophil %: 0.7 %
Eosinophil #: 0 10*3/uL (ref 0.0–0.7)
Eosinophil %: 0.8 %
HCT: 29.6 % — ABNORMAL LOW (ref 40.0–52.0)
HGB: 10.5 g/dL — ABNORMAL LOW (ref 13.0–18.0)
Lymphocyte %: 18 %
MCHC: 35.6 g/dL (ref 32.0–36.0)
MCV: 112 fL — ABNORMAL HIGH (ref 80–100)
Monocyte %: 16.6 %
Neutrophil #: 1.1 10*3/uL — ABNORMAL LOW (ref 1.4–6.5)
Neutrophil %: 63.9 %
RBC: 2.63 10*6/uL — ABNORMAL LOW (ref 4.40–5.90)

## 2012-03-21 LAB — BASIC METABOLIC PANEL
BUN: 26 mg/dL — ABNORMAL HIGH (ref 7–18)
Calcium, Total: 8.6 mg/dL (ref 8.5–10.1)
Chloride: 109 mmol/L — ABNORMAL HIGH (ref 98–107)
Co2: 25 mmol/L (ref 21–32)
Osmolality: 288 (ref 275–301)
Potassium: 3.2 mmol/L — ABNORMAL LOW (ref 3.5–5.1)
Sodium: 142 mmol/L (ref 136–145)

## 2012-03-21 LAB — MAGNESIUM: Magnesium: 1.5 mg/dL — ABNORMAL LOW

## 2012-03-22 LAB — CBC WITH DIFFERENTIAL/PLATELET
Basophil #: 0 10*3/uL (ref 0.0–0.1)
Basophil %: 0.4 %
Eosinophil %: 0.6 %
HCT: 27.6 % — ABNORMAL LOW (ref 40.0–52.0)
HGB: 9.5 g/dL — ABNORMAL LOW (ref 13.0–18.0)
Lymphocyte %: 24.3 %
MCHC: 34.3 g/dL (ref 32.0–36.0)
MCV: 113 fL — ABNORMAL HIGH (ref 80–100)
Monocyte %: 16 %
Neutrophil #: 1 10*3/uL — ABNORMAL LOW (ref 1.4–6.5)
RBC: 2.43 10*6/uL — ABNORMAL LOW (ref 4.40–5.90)
WBC: 1.6 10*3/uL — CL (ref 3.8–10.6)

## 2012-03-22 LAB — MAGNESIUM: Magnesium: 1.9 mg/dL

## 2012-03-25 LAB — CBC WITH DIFFERENTIAL/PLATELET
Basophil #: 0 10*3/uL (ref 0.0–0.1)
Eosinophil #: 0 10*3/uL (ref 0.0–0.7)
Eosinophil %: 0.2 %
HCT: 27.2 % — ABNORMAL LOW (ref 40.0–52.0)
HGB: 9.6 g/dL — ABNORMAL LOW (ref 13.0–18.0)
MCH: 39.7 pg — ABNORMAL HIGH (ref 26.0–34.0)
MCV: 113 fL — ABNORMAL HIGH (ref 80–100)
Monocyte #: 0.3 x10 3/mm (ref 0.2–1.0)
Neutrophil #: 1.5 10*3/uL (ref 1.4–6.5)
Neutrophil %: 69.7 %
Platelet: 98 10*3/uL — ABNORMAL LOW (ref 150–440)
RBC: 2.41 10*6/uL — ABNORMAL LOW (ref 4.40–5.90)
RDW: 16.7 % — ABNORMAL HIGH (ref 11.5–14.5)

## 2012-03-25 LAB — BASIC METABOLIC PANEL
Calcium, Total: 8.5 mg/dL (ref 8.5–10.1)
Chloride: 110 mmol/L — ABNORMAL HIGH (ref 98–107)
Co2: 26 mmol/L (ref 21–32)
EGFR (Non-African Amer.): >60
Glucose: 115 mg/dL — ABNORMAL HIGH (ref 65–99)
Potassium: 4.5 mmol/L (ref 3.5–5.1)
Sodium: 140 mmol/L (ref 136–145)

## 2012-04-07 ENCOUNTER — Ambulatory Visit: Payer: Self-pay | Admitting: Internal Medicine

## 2012-04-23 ENCOUNTER — Ambulatory Visit: Payer: Self-pay | Admitting: Internal Medicine

## 2012-04-23 LAB — CBC CANCER CENTER
Basophil #: 0 x10 3/mm (ref 0.0–0.1)
Eosinophil #: 0 x10 3/mm (ref 0.0–0.7)
Eosinophil %: 0.5 %
Lymphocyte %: 16.4 %
MCV: 112 fL — ABNORMAL HIGH (ref 80–100)
Monocyte %: 18.1 %
Neutrophil #: 2.3 x10 3/mm (ref 1.4–6.5)
Neutrophil %: 64.2 %
Platelet: 128 x10 3/mm — ABNORMAL LOW (ref 150–440)
RBC: 2.89 10*6/uL — ABNORMAL LOW (ref 4.40–5.90)
WBC: 3.6 x10 3/mm — ABNORMAL LOW (ref 3.8–10.6)

## 2012-04-23 LAB — CREATININE, SERUM
EGFR (African American): >60
EGFR (Non-African Amer.): >60

## 2012-04-23 LAB — HEPATIC FUNCTION PANEL A (ARMC)
Albumin: 3.2 g/dL — ABNORMAL LOW (ref 3.4–5.0)
SGOT(AST): 13 U/L — ABNORMAL LOW (ref 15–37)

## 2012-04-23 LAB — CALCIUM: Calcium, Total: 9.5 mg/dL (ref 8.5–10.1)

## 2012-05-08 ENCOUNTER — Ambulatory Visit: Payer: Self-pay | Admitting: Internal Medicine

## 2012-06-23 ENCOUNTER — Emergency Department: Payer: Self-pay | Admitting: Internal Medicine

## 2012-06-23 LAB — URINALYSIS, COMPLETE
Bilirubin,UR: NEGATIVE
Leukocyte Esterase: NEGATIVE
Nitrite: NEGATIVE
Protein: NEGATIVE
Specific Gravity: 1.023 (ref 1.003–1.030)
Squamous Epithelial: 1
WBC UR: 1 /HPF (ref 0–5)

## 2012-06-23 LAB — CBC
HCT: 38.7 % — ABNORMAL LOW (ref 40.0–52.0)
HGB: 12.5 g/dL — ABNORMAL LOW (ref 13.0–18.0)
MCH: 34.8 pg — ABNORMAL HIGH (ref 26.0–34.0)
MCV: 107 fL — ABNORMAL HIGH (ref 80–100)
Platelet: 157 10*3/uL (ref 150–440)
RDW: 13 % (ref 11.5–14.5)
WBC: 3.6 10*3/uL — ABNORMAL LOW (ref 3.8–10.6)

## 2012-06-23 LAB — TROPONIN I: Troponin-I: <0.02 ng/mL

## 2012-06-23 LAB — COMPREHENSIVE METABOLIC PANEL
Alkaline Phosphatase: 74 U/L (ref 50–136)
Anion Gap: 6 — ABNORMAL LOW (ref 7–16)
Calcium, Total: 9.4 mg/dL (ref 8.5–10.1)
Creatinine: 1.3 mg/dL (ref 0.60–1.30)
EGFR (African American): >60
EGFR (Non-African Amer.): 56 — ABNORMAL LOW
Osmolality: 285 (ref 275–301)
SGOT(AST): 16 U/L (ref 15–37)
Total Protein: 7.8 g/dL (ref 6.4–8.2)

## 2012-06-23 LAB — CK TOTAL AND CKMB (NOT AT ARMC)
CK, Total: 33 U/L — ABNORMAL LOW (ref 35–232)
CK-MB: <0.5 ng/mL — ABNORMAL LOW (ref 0.5–3.6)

## 2012-07-16 ENCOUNTER — Ambulatory Visit: Payer: Self-pay | Admitting: Internal Medicine

## 2012-07-16 LAB — CBC CANCER CENTER
Basophil %: 0.8 %
Eosinophil #: 0.1 x10 3/mm (ref 0.0–0.7)
Eosinophil %: 3 %
HCT: 39.9 % — ABNORMAL LOW (ref 40.0–52.0)
HGB: 13.5 g/dL (ref 13.0–18.0)
MCH: 34.9 pg — ABNORMAL HIGH (ref 26.0–34.0)
MCHC: 33.9 g/dL (ref 32.0–36.0)
Neutrophil #: 2.4 x10 3/mm (ref 1.4–6.5)
RDW: 13.1 % (ref 11.5–14.5)
WBC: 3.9 x10 3/mm (ref 3.8–10.6)

## 2012-07-16 LAB — HEPATIC FUNCTION PANEL A (ARMC)
Albumin: 3.6 g/dL (ref 3.4–5.0)
Alkaline Phosphatase: 79 U/L (ref 50–136)
Bilirubin, Direct: 0.1 mg/dL (ref 0.00–0.20)
Bilirubin,Total: 0.5 mg/dL (ref 0.2–1.0)
SGOT(AST): 12 U/L — ABNORMAL LOW (ref 15–37)

## 2012-07-16 LAB — CREATININE, SERUM
EGFR (African American): 59 — ABNORMAL LOW
EGFR (Non-African Amer.): 51 — ABNORMAL LOW

## 2012-07-17 ENCOUNTER — Ambulatory Visit: Payer: Self-pay | Admitting: Internal Medicine

## 2012-08-06 ENCOUNTER — Ambulatory Visit: Payer: Self-pay | Admitting: Internal Medicine

## 2012-09-05 ENCOUNTER — Ambulatory Visit: Payer: Self-pay | Admitting: Internal Medicine

## 2012-10-08 ENCOUNTER — Ambulatory Visit: Payer: Self-pay | Admitting: Internal Medicine

## 2012-10-09 LAB — CBC CANCER CENTER
Basophil #: 0.1 x10 3/mm (ref 0.0–0.1)
Basophil %: 1.2 %
MCH: 34 pg (ref 26.0–34.0)
MCHC: 35.3 g/dL (ref 32.0–36.0)
Monocyte %: 12.2 %
Neutrophil #: 2.9 x10 3/mm (ref 1.4–6.5)
Neutrophil %: 63.8 %
RBC: 3.37 10*6/uL — ABNORMAL LOW (ref 4.40–5.90)
RDW: 14.1 % (ref 11.5–14.5)
WBC: 4.5 x10 3/mm (ref 3.8–10.6)

## 2012-10-09 LAB — BASIC METABOLIC PANEL
BUN: 20 mg/dL — ABNORMAL HIGH (ref 7–18)
Chloride: 105 mmol/L (ref 98–107)
Co2: 24 mmol/L (ref 21–32)
Creatinine: 1.33 mg/dL — ABNORMAL HIGH (ref 0.60–1.30)
EGFR (African American): >60
EGFR (Non-African Amer.): 55 — ABNORMAL LOW
Glucose: 81 mg/dL (ref 65–99)
Potassium: 3.9 mmol/L (ref 3.5–5.1)

## 2012-10-09 LAB — HEPATIC FUNCTION PANEL A (ARMC)
Albumin: 3.5 g/dL (ref 3.4–5.0)
Bilirubin, Direct: 0.1 mg/dL (ref 0.00–0.20)
Bilirubin,Total: 0.4 mg/dL (ref 0.2–1.0)
SGOT(AST): 17 U/L (ref 15–37)
SGPT (ALT): 14 U/L (ref 12–78)

## 2012-11-05 ENCOUNTER — Ambulatory Visit: Payer: Self-pay | Admitting: Internal Medicine

## 2012-12-06 ENCOUNTER — Ambulatory Visit: Payer: Self-pay | Admitting: Internal Medicine

## 2013-01-01 LAB — CBC CANCER CENTER
Basophil #: 0.1 x10 3/mm (ref 0.0–0.1)
Basophil %: 1.4 %
MCH: 36.6 pg — ABNORMAL HIGH (ref 26.0–34.0)
MCHC: 35.8 g/dL (ref 32.0–36.0)
Monocyte %: 11.8 %
Neutrophil %: 65.2 %
RBC: 3.89 10*6/uL — ABNORMAL LOW (ref 4.40–5.90)

## 2013-01-01 LAB — HEPATIC FUNCTION PANEL A (ARMC)
Albumin: 3.3 g/dL — ABNORMAL LOW (ref 3.4–5.0)
Alkaline Phosphatase: 87 U/L (ref 50–136)
Bilirubin, Direct: 0.1 mg/dL (ref 0.00–0.20)
Bilirubin,Total: 0.5 mg/dL (ref 0.2–1.0)
SGPT (ALT): 12 U/L (ref 12–78)
Total Protein: 7.4 g/dL (ref 6.4–8.2)

## 2013-01-01 LAB — CREATININE, SERUM
Creatinine: 1.45 mg/dL — ABNORMAL HIGH (ref 0.60–1.30)
EGFR (African American): 57 — ABNORMAL LOW
EGFR (Non-African Amer.): 49 — ABNORMAL LOW

## 2013-01-06 ENCOUNTER — Ambulatory Visit: Payer: Self-pay | Admitting: Internal Medicine

## 2013-02-12 ENCOUNTER — Ambulatory Visit: Payer: Self-pay | Admitting: Internal Medicine

## 2013-03-08 ENCOUNTER — Ambulatory Visit: Payer: Self-pay | Admitting: Internal Medicine

## 2013-03-26 LAB — CBC CANCER CENTER
Basophil #: 0 x10 3/mm (ref 0.0–0.1)
Basophil %: 0.8 %
Eosinophil #: 0.1 x10 3/mm (ref 0.0–0.7)
Eosinophil %: 2.4 %
HCT: 40.8 % (ref 40.0–52.0)
Lymphocyte #: 1 x10 3/mm (ref 1.0–3.6)
Lymphocyte %: 17.4 %
MCH: 34.8 pg — ABNORMAL HIGH (ref 26.0–34.0)
Monocyte #: 0.6 x10 3/mm (ref 0.2–1.0)
Monocyte %: 9.9 %
Neutrophil #: 3.9 x10 3/mm (ref 1.4–6.5)
RBC: 3.95 10*6/uL — ABNORMAL LOW (ref 4.40–5.90)

## 2013-03-26 LAB — HEPATIC FUNCTION PANEL A (ARMC)
Bilirubin, Direct: 0.2 mg/dL (ref 0.00–0.20)
Bilirubin,Total: 0.8 mg/dL (ref 0.2–1.0)
SGOT(AST): 12 U/L — ABNORMAL LOW (ref 15–37)
SGPT (ALT): 10 U/L — ABNORMAL LOW (ref 12–78)
Total Protein: 7.4 g/dL (ref 6.4–8.2)

## 2013-03-26 LAB — CALCIUM: Calcium, Total: 9.4 mg/dL (ref 8.5–10.1)

## 2013-04-07 ENCOUNTER — Ambulatory Visit: Payer: Self-pay | Admitting: Internal Medicine

## 2013-05-08 ENCOUNTER — Ambulatory Visit: Payer: Self-pay | Admitting: Internal Medicine

## 2013-06-17 ENCOUNTER — Ambulatory Visit: Payer: Self-pay | Admitting: Internal Medicine

## 2013-07-06 ENCOUNTER — Ambulatory Visit: Payer: Self-pay | Admitting: Internal Medicine

## 2013-07-30 LAB — CBC CANCER CENTER
Basophil #: 0 x10 3/mm (ref 0.0–0.1)
Basophil %: 0.6 %
EOS ABS: 0.1 x10 3/mm (ref 0.0–0.7)
Eosinophil %: 1.6 %
HCT: 44 % (ref 40.0–52.0)
HGB: 14.5 g/dL (ref 13.0–18.0)
LYMPHS PCT: 14.4 %
Lymphocyte #: 0.9 x10 3/mm — ABNORMAL LOW (ref 1.0–3.6)
MCH: 35 pg — AB (ref 26.0–34.0)
MCHC: 33 g/dL (ref 32.0–36.0)
MCV: 106 fL — ABNORMAL HIGH (ref 80–100)
MONOS PCT: 10.2 %
Monocyte #: 0.6 x10 3/mm (ref 0.2–1.0)
Neutrophil #: 4.5 x10 3/mm (ref 1.4–6.5)
Neutrophil %: 73.2 %
Platelet: 137 x10 3/mm — ABNORMAL LOW (ref 150–440)
RBC: 4.15 10*6/uL — ABNORMAL LOW (ref 4.40–5.90)
RDW: 15.2 % — ABNORMAL HIGH (ref 11.5–14.5)
WBC: 6.1 x10 3/mm (ref 3.8–10.6)

## 2013-07-30 LAB — HEPATIC FUNCTION PANEL A (ARMC)
ALBUMIN: 3.4 g/dL (ref 3.4–5.0)
Alkaline Phosphatase: 78 U/L
Bilirubin, Direct: 0.2 mg/dL (ref 0.00–0.20)
Bilirubin,Total: 0.8 mg/dL (ref 0.2–1.0)
SGOT(AST): 14 U/L — ABNORMAL LOW (ref 15–37)
SGPT (ALT): 6 U/L — ABNORMAL LOW (ref 12–78)
Total Protein: 7.4 g/dL (ref 6.4–8.2)

## 2013-07-30 LAB — CREATININE, SERUM
Creatinine: 1.29 mg/dL (ref 0.60–1.30)
EGFR (Non-African Amer.): 56 — ABNORMAL LOW

## 2013-07-30 LAB — CALCIUM: Calcium, Total: 8.8 mg/dL (ref 8.5–10.1)

## 2013-08-06 ENCOUNTER — Ambulatory Visit: Payer: Self-pay | Admitting: Internal Medicine

## 2013-09-10 ENCOUNTER — Ambulatory Visit: Payer: Self-pay | Admitting: Internal Medicine

## 2013-10-06 ENCOUNTER — Ambulatory Visit: Payer: Self-pay | Admitting: Internal Medicine

## 2013-11-05 ENCOUNTER — Ambulatory Visit: Payer: Self-pay | Admitting: Internal Medicine

## 2013-12-03 LAB — CBC CANCER CENTER
BASOS ABS: 0 x10 3/mm (ref 0.0–0.1)
BASOS PCT: 0.9 %
Eosinophil #: 0.1 x10 3/mm (ref 0.0–0.7)
Eosinophil %: 2.3 %
HCT: 40.8 % (ref 40.0–52.0)
HGB: 13.7 g/dL (ref 13.0–18.0)
Lymphocyte #: 0.8 x10 3/mm — ABNORMAL LOW (ref 1.0–3.6)
Lymphocyte %: 16.4 %
MCH: 36.3 pg — ABNORMAL HIGH (ref 26.0–34.0)
MCHC: 33.6 g/dL (ref 32.0–36.0)
MCV: 108 fL — AB (ref 80–100)
Monocyte #: 0.6 x10 3/mm (ref 0.2–1.0)
Monocyte %: 12.1 %
NEUTROS ABS: 3.5 x10 3/mm (ref 1.4–6.5)
Neutrophil %: 68.3 %
PLATELETS: 207 x10 3/mm (ref 150–440)
RBC: 3.79 10*6/uL — AB (ref 4.40–5.90)
RDW: 16.4 % — AB (ref 11.5–14.5)
WBC: 5.2 x10 3/mm (ref 3.8–10.6)

## 2013-12-03 LAB — HEPATIC FUNCTION PANEL A (ARMC)
ALBUMIN: 3.2 g/dL — AB (ref 3.4–5.0)
ALK PHOS: 90 U/L
Bilirubin, Direct: 0.1 mg/dL (ref 0.00–0.20)
Bilirubin,Total: 0.4 mg/dL (ref 0.2–1.0)
SGOT(AST): 13 U/L — ABNORMAL LOW (ref 15–37)
SGPT (ALT): 11 U/L — ABNORMAL LOW
Total Protein: 7.1 g/dL (ref 6.4–8.2)

## 2013-12-03 LAB — BASIC METABOLIC PANEL
Anion Gap: 6 — ABNORMAL LOW (ref 7–16)
BUN: 14 mg/dL (ref 7–18)
CHLORIDE: 105 mmol/L (ref 98–107)
CREATININE: 1.42 mg/dL — AB (ref 0.60–1.30)
Calcium, Total: 9.1 mg/dL (ref 8.5–10.1)
Co2: 32 mmol/L (ref 21–32)
EGFR (Non-African Amer.): 50 — ABNORMAL LOW
GFR CALC AF AMER: 58 — AB
Glucose: 83 mg/dL (ref 65–99)
Osmolality: 285 (ref 275–301)
Potassium: 3.6 mmol/L (ref 3.5–5.1)
Sodium: 143 mmol/L (ref 136–145)

## 2013-12-06 ENCOUNTER — Ambulatory Visit: Payer: Self-pay | Admitting: Internal Medicine

## 2014-01-15 ENCOUNTER — Ambulatory Visit: Payer: Self-pay | Admitting: Internal Medicine

## 2014-02-05 ENCOUNTER — Ambulatory Visit: Payer: Self-pay | Admitting: Internal Medicine

## 2014-03-08 ENCOUNTER — Ambulatory Visit: Payer: Self-pay | Admitting: Internal Medicine

## 2014-04-08 ENCOUNTER — Ambulatory Visit: Payer: Self-pay | Admitting: Internal Medicine

## 2014-05-08 ENCOUNTER — Ambulatory Visit: Payer: Self-pay | Admitting: Internal Medicine

## 2014-06-05 LAB — HEPATIC FUNCTION PANEL A (ARMC)
ALBUMIN: 3.6 g/dL (ref 3.4–5.0)
ALT: 7 U/L — AB (ref 14–63)
Alkaline Phosphatase: 84 U/L (ref 46–116)
Bilirubin, Direct: 0.1 mg/dL (ref 0.0–0.2)
Bilirubin,Total: 0.6 mg/dL (ref 0.2–1.0)
SGOT(AST): 16 U/L (ref 15–37)
TOTAL PROTEIN: 7.7 g/dL (ref 6.4–8.2)

## 2014-06-05 LAB — CBC CANCER CENTER
Basophil #: 0.1 x10 3/mm (ref 0.0–0.1)
Basophil %: 1.1 %
Eosinophil #: 0.5 x10 3/mm (ref 0.0–0.7)
Eosinophil %: 8.4 %
HCT: 43.9 % (ref 40.0–52.0)
HGB: 14.5 g/dL (ref 13.0–18.0)
LYMPHS PCT: 16.5 %
Lymphocyte #: 0.9 x10 3/mm — ABNORMAL LOW (ref 1.0–3.6)
MCH: 36.1 pg — AB (ref 26.0–34.0)
MCHC: 33 g/dL (ref 32.0–36.0)
MCV: 109 fL — ABNORMAL HIGH (ref 80–100)
Monocyte #: 0.6 x10 3/mm (ref 0.2–1.0)
Monocyte %: 11.8 %
NEUTROS ABS: 3.4 x10 3/mm (ref 1.4–6.5)
NEUTROS PCT: 62.2 %
Platelet: 195 x10 3/mm (ref 150–440)
RBC: 4.02 10*6/uL — ABNORMAL LOW (ref 4.40–5.90)
RDW: 16.9 % — AB (ref 11.5–14.5)
WBC: 5.5 x10 3/mm (ref 3.8–10.6)

## 2014-06-05 LAB — BASIC METABOLIC PANEL
ANION GAP: 8 (ref 7–16)
BUN: 11 mg/dL (ref 7–18)
CALCIUM: 9.1 mg/dL (ref 8.5–10.1)
Chloride: 104 mmol/L (ref 98–107)
Co2: 32 mmol/L (ref 21–32)
Creatinine: 1.26 mg/dL (ref 0.60–1.30)
GLUCOSE: 85 mg/dL (ref 65–99)
Osmolality: 285 (ref 275–301)
Potassium: 3.7 mmol/L (ref 3.5–5.1)
Sodium: 144 mmol/L (ref 136–145)

## 2014-06-08 ENCOUNTER — Ambulatory Visit: Payer: Self-pay | Admitting: Internal Medicine

## 2014-07-07 ENCOUNTER — Ambulatory Visit
Admit: 2014-07-07 | Disposition: A | Discharge status: Home / Self Care | Payer: Self-pay | Attending: Internal Medicine | Admitting: Internal Medicine

## 2014-08-13 ENCOUNTER — Ambulatory Visit
Admit: 2014-08-13 | Disposition: A | Discharge status: Home / Self Care | Payer: Self-pay | Attending: Internal Medicine | Admitting: Internal Medicine

## 2014-08-25 NOTE — Discharge Summary (Signed)
PATIENT NAME:  Vernon Hayes, DERKSEN MR#:  [id number] DATE OF BIRTH:  Nov 06, 1945  DATE OF ADMISSION:  03/21/2012 DATE OF DISCHARGE:  03/26/2012  DISCHARGE DIAGNOSES:  1. Head and neck cancer status post chemotherapy, completed radiation 03/26/2012.  2. Vomiting, diarrhea, and dehydration - improved.  3. Oral mucositis/stomatitis secondary to chemoradiation - improving.   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 The patient was admitted to the hospital on 03/20/2012 after he presented with persistent severe watery diarrhea and vomiting of 4 to 5 days duration. He was clinically dehydrated with severe progressive weakness, inability to eat or drink. He was lightheaded on getting up and ambulating. No fever or chills.   For past medical history, surgical history, family History, allergies, home medications, review of systems, exam, and labs - refer to history and physical note for details.   HOSPITAL COURSE: The patient was admitted to the oncology floor and started on IV fluids D5 half-normal saline 125 mL per hour and urine output and renal function and electrolytes were monitored. He was also started on empiric IV Flagyl for severe diarrhea. Stool studies including culture and C. difficile toxin was sent which returned negative. He was given IV antiemetics as needed for nausea and vomiting. HCTZ was held due to dehydration and blood pressure monitored. He was continued on oxycodone, Carafate, and Dukes mouthwash p.r.n. for pain and stomatitis supportive treatment. Labs showed hemoglobin 12.3, platelets 130, WBC 1200, and creatinine 1.2 upon admission. With above supportive treatment, the patient slowly showed improvement. By 03/24/2012 oral intake was still poor but was started on PEG tube feeding four cans a day and was  tolerating this well. This was therefore increased to five cans daily. By 03/26/2012, the patient began to feel slightly stronger, eating better by mouth, including grill cheese sandwich. He was getting PEG tube feedings, five cans a day, and tolerating it well so far. No nausea, vomiting, or diarrhea. Mucositis stomatitis also showed good improvement with supportive treatment and short course of Decadron taper. The patient was therefore discharged home with advice to keep follow-up appointment on 04/24/2012 at the [facility], the same as previously scheduled, since he did not want to come back sooner for reevaluation.   DISCHARGE MEDICATIONS:  1. Oxycodone 5 to 10 mg every 3 to 4 hours p.r.n. for pain.  2. Dukes Magic mouthwash 5 mL four times daily p.r.n.  3. Carafate 1 gram four times daily p.r.n.  4. Senna 8.6 mg daily at bedtime.  5. Magnesium oxide 400 mg one tablet daily.   DISCHARGE DIET: Low sodium. Jevity 1.5 cans, 237 mL, five times daily via PEG tube, flush with 60 mL of water after each feed.   ACTIVITY: As tolerated.   DISCHARGE FOLLOWUP: Followup at the [facility] on 04/24/2012.   In between visits, the patient is advised to call or come to the ER in case of any recurrent symptoms or sickness.  ____________________________ Rhett Bannister Ma Hillock, MD srp:slb D: 04/02/2012 11:28:44 ET T: 04/02/2012 13:13:04 ET JOB#: [id number]  cc: Mozel Burdett R. Ma Hillock, MD, <Dictator> Alveta Heimlich MD ELECTRONICALLY SIGNED 04/02/2012 23:52

## 2014-08-25 NOTE — Consult Note (Signed)
Reason for Visit: This 69 year old Male patient presents to the clinic for initial evaluation of  Head and neck cancer .   Referred by Dr. Ma Hillock.  Diagnosis:   Chief Complaint/Diagnosis   69 year old male with locally advanced head and neck cancer originating from base of tongue T3, N2, M0 status post induction chemotherapy with excellent response.   Pathology Report Pathology report reviewed    Imaging Report PET/CT scan reviewed    Referral Report Clinical notes reviewed    Planned Treatment Regimen IMRT radiation therapy with cis-platinum radiation sensitizer    HPI   patient is a 69 year old male originally diagnosed at [facility] when he presented with nodularity and growing adenopathy bilaterally in his neck. He was noted a large ulcerative mass in the base of tongue fine-needle aspirate positive on 09/29/11 at [facility] showing metastatic squamous cell carcinoma. He was having significant oral pain and some dysphasia. His care was transferred to Dr. Ma Hillock has undergone cisplatin 5-FU and Taxotere induction chemotherapy for 2 cycles. He has had an excellent response with almost complete diminution of adenopathy in his neck. He is having no dysphasia or head and neck pain at this time. He does have a feeding tube although is anxious to return some oral feedings. He has no evidence of cough hemoptysis or bone pain.patient did have proximal benign teeth extracted at [facility] in preparation for treatment.  Past Hx:    cancer in neck:    htn:    Hernia Repair:   Past, Family and Social History:   Past Medical History positive    Cardiovascular hypertension    Past Surgical History Herniorrhaphy repair    Family History noncontributory    Social History positive    Social History Comments Significant OTO H. abuse in the past, 30-pack-year smoking history    Additional Past Medical and Surgical History Seen by himself today   Allergies:   Tetracycline:  Hives  Home Meds:  Home Medications: Medication Instructions Status  cephalexin 250 mg oral tablet 1 tab(s) orally 4 times a day Active  oxyCODONE 5 mg oral tablet 1 - 2 tab(s) orally every 3 - 4 hours as needed for pain Active  Dukes Magic mouthwash 97m   swish and swallow/spit 4 times a day when necesary for soreness in the mouth or throat Active  Tums Ultra 1000 mg oral tablet, chewable  orally 3 times a day when necessary Active  Cal-Gest 500 mg oral tablet, chewable 2 tab(s) orally 3 times a day, As Needed- for Indigestion, Heartburn  Active  hydralazine-hydrochlorothiazide 25 mg-25 mg oral capsule 1 cap(s) orally once a day Active  jevity 1.5 1 dose(s)  3 times a day Active  Protonix 40 mg oral delayed release tablet tab(s) orally once a day Active   Review of Systems:   General negative    Performance Status (ECOG) 0    Skin negative    Breast negative    Ophthalmologic negative    ENMT see HPI    Respiratory and Thorax negative    Cardiovascular negative    Gastrointestinal negative    Genitourinary negative    Musculoskeletal negative    Neurological negative    Psychiatric negative    Hematology/Lymphatics negative    Endocrine negative    Allergic/Immunologic negative    Review of Systems   Patient denies any weight loss, fatigue, weakness, fever, chills or night sweats. Patient denies any loss of vision, blurred vision. Patient  denies any ringing  of the ears or hearing loss. No irregular heartbeat. Patient denies heart murmur or history of fainting. Patient denies any chest pain or pain radiating to her upper extremities. Patient denies any shortness of breath, difficulty breathing at night, cough or hemoptysis. Patient denies any swelling in the lower legs. Patient denies any nausea vomiting, vomiting of blood, or coffee ground material in the vomitus. Patient denies any stomach pain. Patient states has had normal bowel movements no significant  constipation or diarrhea. Patient denies any dysuria, hematuria or significant nocturia. Patient denies any problems walking, swelling in the joints or loss of balance. Patient denies any skin changes, loss of hair or loss of weight. Patient denies any excessive worrying or anxiety or significant depression. Patient denies any problems with insomnia. Patient denies excessive thirst, polyuria, polydipsia. Patient denies any swollen glands, patient denies easy bruising or easy bleeding. Patient denies any recent infections, allergies or URI. Patient "s visual fields have not changed significantly in recent time.  Nursing Notes:  Nursing Vital Signs and Chemo Nursing Nursing Notes: *CC Vital Signs Flowsheet:   04-Sep-13 09:15   Temp Temperature 95   Pulse Pulse 58   Respirations Respirations 20   SBP SBP 148   DBP DBP 95   Pain Scale (0-10)  0   Current Weight (kg) (kg) 65.5   Height (cm) centimeters 182.8   BSA (m2) 1.8   Physical Exam:  General/Skin/HEENT:   General normal    Skin normal    Eyes normal    Additional PE Well-developed thin male in NAD. Oral cavity shows teeth in good state repair he has had multiple teeth extractions. Gum line is found. No oral mucosal lesions are identified. Indirect mirror examination shows base of tongue without evidence of nodularity or mass. Cords approximating well vallecula is within normal limits. Neck is clear there is some subtle small adenopathy present in the bilateral cervical chains nothing predominant. No supraclavicular adenopathy is appreciated. Lungs are clear to A&P cardiac examination shows regular rate and rhythm.   Breasts/Resp/CV/GI/GU:   Respiratory and Thorax normal    Cardiovascular normal    Gastrointestinal normal    Genitourinary normal   MS/Neuro/Psych/Lymph:   Musculoskeletal normal    Neurological normal    Lymphatics normal   Assessment and Plan:  Impression:   locally advanced base of tongue squamous cell  carcinoma in 69 year old male with excellent results secondary to induction chemotherapy  Plan:   at the stomach to go ahead with IMRT radiation therapy with cisplatin chemotherapy weekly basis for radiation sensitizing affect. I would plan on delivering 7000 cGy based on his initial PET/CT scan to both his base of tongue and positive neck adenopathy. Risks and benefits of treatment including potential xerostomia, sore throat, skin reaction and alteration blood counts were all explained in detail to the patient. I have set him up for CT simulation early next week. I discussed the case personally with Dr. Ma Hillock who will be monitoring his cisplatin-based therapy. We will coordinate his concurrent treatment after simulation is complete.  I would like to take this opportunity to thank you for allowing me to continue to participate in this patient's care.  CC Referral:   cc: Dr. Nicky Pugh   Electronic Signatures: Baruch Gouty, Roda Shutters (MD)  (Signed 04-Sep-13 14:20)  Authored: HPI, Diagnosis, Past Hx, PFSH, Allergies, Home Meds, ROS, Nursing Notes, Physical Exam, Encounter Assessment and Plan, CC Referring Physician   Last Updated: 04-Sep-13 14:20 by Aunica Dauphinee,  Roda Shutters (MD)

## 2014-08-25 NOTE — H&P (Signed)
PATIENT NAME:  Vernon Hayes, Vernon Hayes MR#:  [id number] DATE OF BIRTH:  February 13, 1946  DATE OF ADMISSION:  03/20/2012  CHIEF COMPLAINT/REASON FOR ADMISSION: History of head and neck cancer, recently completed chemoradiation, presents with persistent severe watery diarrhea and vomiting of 4 to 5 days duration, clinical dehydration with severe progressive weakness, inability to eat or drink, lightheadedness on getting up and ambulating.   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 He initially received chemotherapy and then on concurrent chemoradiation (has two more radiation treatments left) who presented today to the [facility] [place] feeling very sick. The patient reportedly went to the Emergency Room two days ago with complaints of progressive weakness, decreased oral intake and diarrhea. Since then, he states that the diarrhea has markedly worsened and he is having watery stools with mucus almost every hour. He is also having vomiting after he eats with minimal epigastric discomfort. He is unable to keep food or fluid down and is feeling extremely weak, and he has lost a few pounds in the last 2 to 3 days itself. He is feeling dizzy on getting up and lightheaded and is concerned that he will pass out. No fevers or chills. No new urinary symptoms, has decreased urine output. No blood in the stools or melena. No new pain issues. Oxycodone is controlling his throat pain at this time.   PAST MEDICAL HISTORY/PAST SURGICAL HISTORY:  1. Hypertension.  2. Locally advanced squamous cell carcinoma of the right tongue base diagnosed May 2013 as described above.   FAMILY HISTORY: Noncontributory.   SOCIAL HISTORY: A 15 pack-year history, recently quit. He denies alcohol intake. He was a heavy drinker in the past. He denies recreational drug usage.   ALLERGIES: Tetracycline.   MEDICATIONS:   1. HCTZ 25 mg p.o. daily.  2. Potassium chloride 10 mEq p.o. daily. 3. Oxycodone 5 to 10 mg every 3 to 4 hours  p.r.n. for pain. 4. Dukes mouthwash 5 mL q.i.d. p.r.n.  5. Senna 1 tablet at bedtime. 6. Carafate 1 gram t.i.d. p.r.n.   REVIEW OF SYSTEMS: CONSTITUTIONAL: As in history of present illness. No fevers. HEENT: No headaches. No epistaxis, ear or jaw pain. No new sinus symptoms. CARDIAC: No angina, palpitation, orthopnea, or paroxysmal nocturnal dyspnea. LUNGS: Has cough which is bothersome. Otherwise, no progressive dyspnea at rest, sputum, hemoptysis, or chest pain. GASTROINTESTINAL: As in history of present illness. GENITOURINARY: No dysuria or hematuria. SKIN: No new rashes or pruritus. HEMATOLOGIC: No obvious bleeding symptoms. NEUROLOGIC: Has dizziness on getting up and ambulating. Otherwise, new focal weakness, seizures, or loss of consciousness. ENDOCRINE: No polyuria or polydipsia. Oral intake poor currently.   PHYSICAL EXAMINATION:  GENERAL: The patient is very weak and tired looking, otherwise alert and oriented to self, place, person, and time and converses appropriately. No acute distress. No icterus.   VITAL SIGNS:  95, 81, 20, 123/88, 63.7 kg.  HEENT: Normocephalic, atraumatic. Extraocular movements intact. Sclerae anicteric. No oral thrush. Mouth is very dry. Mild erythema.   NECK: Supple without lymphadenopathy.   CARDIOVASCULAR: S1, S2, regular rate and rhythm.   LUNGS: Lungs show bilateral diminished breath sounds overall. No crepitations or rhonchi noted.   ABDOMEN: Soft, minimal epigastric tenderness. No hepatomegaly or masses palpable clinically. Bowel sounds present. No guarding or rigidity.   EXTREMITIES: No major edema or cyanosis.   SKIN: No rashes.   NEUROLOGIC: Nonfocal, cranial  nerves intact. Gait not checked.  LABORATORY, DIAGNOSTIC AND RADIOLOGICAL DATA: Lab results from today are pending. From November 11th: WBC 2100, hemoglobin 12.3, platelets  130.0. Creatinine 1.2, BUN 33, potassium 4.0. Liver functions unremarkable.   IMPRESSION AND PLAN:  1. Locally advanced T3 N2 squamous cell carcinoma of the right tongue base: Initially got chemotherapy, now completing chemoradiation (has two more radiation treatments left).  2. Progressive watery diarrhea, vomiting, severe clinical dehydration: Could be due to infections versus other etiology. Given severe symptoms, we will admit to the hospital under observation admit, start on IV fluids D5 half-normal saline at 125 mL/hr and monitor urine output and renal function and electrolytes. We will get stool culture and C. difficile toxin. Continue Imodium p.r.n. for now, add empiric IV Flagyl 500 mg every 8 hours since he is unable to take p.o. due to vomiting issues. Zofran 4 mg IV every 4 hours p.r.n. for nausea or vomiting. We will get labs today including CBC, comprehensive metabolic panel, lipase.  3. Hold HCTZ for now given dehydration, monitor blood pressure.  4. Continue oxycodone p.r.n. for pain, Carafate p.r.n., Dukes mouthwash p.r.n.   The patient was explained the above, is agreeable to this plan.   ____________________________ Rhett Bannister. Ma Hillock, MD srp:cbb D: 03/20/2012 12:38:18 ET T: 03/20/2012 13:05:30 ET JOB#: [id number]  cc: Janet Decesare R. Ma Hillock, MD, <Dictator> Alveta Heimlich MD ELECTRONICALLY SIGNED 03/20/2012 20:33

## 2014-08-25 NOTE — Discharge Summary (Signed)
PATIENT NAME:  Vernon Hayes, Vernon Hayes MR#:  [id number] DATE OF BIRTH:  03-04-1946  DATE OF ADMISSION:  11/24/2011 DATE OF DISCHARGE:  12/06/2011  DIAGNOSES:  1. 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  2. Mucositis stomatitis secondary to chemotherapy.  3. Acute renal insufficiency secondary to cisplatin chemotherapy.   HISTORY OF PRESENT ILLNESS: Patient is a 69 year old gentleman who was recently diagnosed with stage IV-A 07/19 for starting cycle one induction chemotherapy as recommended by [facility] (was diagnosed there on 05/24 by fine needle aspiration of right neck mass). Patient clinically was doing well without major pain issues at the time of admission. He had feeding tube [facility] at [facility] and was doing 1 to 2 cans per day, eating three meals by mouth, otherwise. He had no headaches, imbalance or seizures.   For past medical history, surgical history, family history, social history, home medications, allergies, review of systems, exam and labs please refer to history and physical note for details.   HOSPITAL COURSE: Patient was admitted to hospital and started on IV hydration. Renal function and electrolytes were monitored. Oral intake was monitored. He was started on cycle one chemotherapy and received cisplatin 75 mg/sq m IV with pre- and posthydration, potassium and magnesium supplement, mannitol 50 grams IV, antiemetics, also got Taxotere 75 mg/sq m IV with premedication, and IV 5-FU infusion 1000 mg/sq m per day x4 days. He also received Neulasta injection on 07/25 for prevention of febrile neutropenia given high risk of myelosuppression (greater than 20%). Patient did develop mucositis and stomatitis which is an expected side effect of chemotherapy causing decreased oral intake and some dehydration and was given IV hydration. He was given Carafate, Dukes mouthwash, viscous lidocaine and other  supportive treatment or mucositis. He also needed oxycodone for pain control. Dietitian was consulted and she recommended Jevity tube feedings, patient was up to five cans per day towards the time of discharge. Also creatinine slowly worsened and went up to 2.15 on 07/30 and 2.28 on 07/31, this was most likely secondary to cisplatin effect and plan was to continue monitoring closely as outpatient since patient was otherwise doing well. He also received quick Decadron taper starting on 07/30 for severe mucositis symptoms and this had started improving well on 12/06/2011. He was also generally weak overall and was seen by physical therapy and is being planned for discharge to rehab facility for short-term stay to get stronger. Also he needed multiple IV sites during admission and per discussion with nurse was a difficult access, therefore plan is to try and get Port-A-Cath placed prior to next cycle of chemotherapy. Patient was explained in details and he was agreeable to this plan.   DISCHARGE MEDICATIONS:  1. HCTZ 25 mg p.o. daily.  2. Senokot 1 tablet at bedtime, hold if loose stools.  3. Viscous lidocaine 2%, 5 mL q.i.d. p.r.n.  4. Dukes Magic mouthwash 5 mL q.i.d. p.r.n.  5. Decadron 4 mg p.o. b.i.d. x2 days and stop.  6. Protonix 40 mg p.o. b.i.d.  7. TUMS 1000 mg p.o. t.i.d. p.r.n.  8. Carafate 1 gram p.o. q.i.d. p.r.n.  9. Phenergan 25 mg q.6 hours p.r.n. for nausea, vomiting.  10. Oxycodone 5 to 10 mg q.3-4h. p.r.n. for pain.   DISCHARGE DIET: Low sodium. Also tube feeding with Jevity 1.5 calories to 37 mL via PEG tube 1 can at 10:00 a.m., 2 cans at 3:00 p.m., 2 cans  at 8:00 p.m.   ACTIVITY: As tolerated, ambulate with help and supervision only.   FOLLOW UP: Follow up at [facility] with Dr. Ma Hillock on 08/05 with CBC and MET-C.   ____________________________ Rhett Bannister. Ma Hillock, MD srp:cms D: 12/06/2011 15:21:49 ET T: 12/06/2011 15:54:02 ET JOB#: [id number]  cc: Ilia Dimaano R. Ma Hillock, MD,  <Dictator> Alveta Heimlich MD ELECTRONICALLY SIGNED 12/06/2011 17:52

## 2014-08-25 NOTE — Op Note (Signed)
PATIENT NAME:  Vernon Hayes, Vernon Hayes MR#:  [id number] DATE OF BIRTH:  05/31/1945  DATE OF PROCEDURE:  12/21/2011  PREOPERATIVE DIAGNOSIS: Tongue cancer with poor venous access.   POSTOPERATIVE DIAGNOSIS: Tongue cancer with poor venous access.   PROCEDURES PERFORMED: 1. Ultrasound guidance for vascular access, right jugular vein. 2. Fluoroscopic guidance for placement of catheter.  3. Placement of a CT compatible Infuse-a-Port, right jugular vein.   SURGEON: Algernon Huxley, M.D.   ANESTHESIA: Local with moderate conscious sedation.   ESTIMATED BLOOD LOSS: Minimal.   FLUOROSCOPY TIME: Less than one minute.   CONTRAST USED: None.   INDICATION FOR PROCEDURE: This is a 69 year old African American male with tongue cancer. He needs a Port-A-Cath for chemotherapy and venous access. Risks and benefits were discussed and informed consent was obtained.   DESCRIPTION OF PROCEDURE: The patient was brought to the vascular interventional radiology suite. The right neck and chest were sterilely prepped and draped and a sterile surgical field was created. The right jugular vein was visualized with ultrasound and found to be widely patent. It was accessed without difficulty with a Seldinger needle and J-wire was placed. After skin nick and dilatation, the peel-away sheath was placed over the wire. I then turned my attention to the subclavicular region. Two fingerbreadths below the right clavicle I anesthetized an area, a transverse incision was created and a pocket was created inferiorly. The port was secured to the chest wall with two Prolene sutures and the catheter was connected to the port and tunneled from the subclavicular incision to the access site. Fluoroscopic guidance was used to cut the catheter to an appropriate length. It was placed through the peel-away sheath and the peel-away sheath was removed. The catheter tip was found to be in excellent location just into the right atrium. It withdrew blood well  and flushed easily with heparinized saline. I flushed the pocket with antibiotic impregnated saline and closed the pocket with a 3-0 Vicryl and a 4-0 Monocryl. The access incision was closed with a single 4-0 Monocryl. Dermabond was placed as a dressing. The patient tolerated the procedure well and was taken to the recovery room in stable condition.  ____________________________ Algernon Huxley, MD jsd:slb D: 12/21/2011 11:02:33 ET T: 12/21/2011 13:38:18 ET JOB#: [id number]  cc: Algernon Huxley, MD, <Dictator> Sandeep R. Ma Hillock, MD  Algernon Huxley MD ELECTRONICALLY SIGNED 12/21/2011 16:44

## 2014-08-30 NOTE — Consult Note (Signed)
Reason for Visit: This 69 year old Male patient presents to the clinic for initial evaluation of  And neck cancer .   Referred by Dr. Catalina Pizza [facility].  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 This presented over 5 months. He eventually was seen at [facility] emergency room with a 30 pound weight loss and CT scan of the head and neck region showed probable base of tongue lesion with bilateral lymphadenopathy of the neck. Fine-needle aspiration was performed on 10/06/11 showing squamous cell carcinoma. Not enough tissue is obtained for HPV determination. He underwent staging workup with a CT scan showing a 7 mm hypodensity in the right hepatic lobe no other evidence of metastatic disease. This was thought to be incidental finding and not related to his head and neck cancer. He has not started any treatment at this time. Recommendation at [facility] tumor board was for induction chemotherapy followed by combined modality treatment. He is seen today in our clinic. He has had a feeding tube placed. He still is having significant head and neck pain and dysphagia.  Past Hx:    htn:    Hernia Repair:   Past, Family and Social History:   Past Medical History positive    Cardiovascular hypertension    Past Surgical History Herniorrhaphy repair    Family History noncontributory    Social History positive    Social History Comments Greater than 30-pack-year smoking history, significant significant EtOH use he states now is not drinking. He  works as a Restaurant manager, fast food and Surgical History Seen by himself today   Allergies:   No Known Allergies:   Review of Systems:   General negative    Performance Status (ECOG) 0    Skin negative    Breast negative    Ophthalmologic negative    ENMT see HPI    Respiratory and Thorax negative    Cardiovascular negative    Gastrointestinal negative    Genitourinary negative    Musculoskeletal negative    Neurological negative    Psychiatric negative    Hematology/Lymphatics negative    Endocrine negative    Allergic/Immunologic negative   Nursing Notes:  Nursing Vital Signs and Chemo Nursing Nursing Notes: *CC Vital Signs Flowsheet:   12-Jul-13 14:33   Temp Temperature 97.7   Pulse Pulse 69   Respirations Respirations 16   SBP SBP 166   DBP DBP 105   Pain Scale (0-10)  0   Current Weight (kg) (kg) 71.1   Physical Exam:  General/Skin/HEENT:   General normal    Skin normal    Eyes normal    Additional PE Well-developed male in NAD. Oral cavity shows teeth in a fair state of repair. He has significant mass in the base of tongue extending into the right tonsillar region and crossing the midline. He also has bilateral lift lymphadenopathy measuring up to 3 cm in greatest dimension. Indirect mirror examination shows upper airway clear vallecula is also involved. Lungs are clear  to A&P cardiac examination shows regular rate and rhythm.   Breasts/Resp/CV/GI/GU:   Respiratory and Thorax normal    Cardiovascular normal    Gastrointestinal normal    Genitourinary normal   MS/Neuro/Psych/Lymph:   Musculoskeletal normal    Neurological normal    Lymphatics normal   Assessment and Plan:  Impression:   locally advanced squamous cell carcinoma the base of tongue and 69 year old male.  Plan:   at this time I like to have a PET/CT scan for total outline of the tumor dimensions for radiation collagen treatment planning. Patient will  also be seen by medical oncology today. Have discussed the case personally with Dr. Ma Hillock and will offer induction chemotherapy. Would followup with 7000 cGy can no areas of solid tumor involvement. Risks and benefits of treatment were reviewed with the patientfurther dysphasialoss of taste alteration of blood values and dryness of mouth were all explained in detail to the patient. Patient was also seen today by Dr. Ma Hillock who will be reviewing PET/CT scan with me and ordering his induction chemotherapy. I will reevaluate him after completion of induction chemotherapy as per Dr. Ma Hillock.  I would like to take this opportunity to thank you for allowing me to continue to participate in this patient's care.  CC Referral:   cc: Dr. Catalina Pizza [facility]   Electronic Signatures: Armstead Peaks (MD)  (Signed 12-Jul-13 15:41)  Authored: HPI, Diagnosis, Past Hx, PFSH, Allergies, ROS, Nursing Notes, Physical Exam, Encounter Assessment and Plan, CC Referring Physician   Last Updated: 12-Jul-13 15:41 by Armstead Peaks (MD)

## 2014-09-15 ENCOUNTER — Telehealth: Payer: Self-pay | Admitting: *Deleted

## 2014-09-15 NOTE — Telephone Encounter (Signed)
Tia called PCP DR. [place] office and they have not refilled b/p since 2014 and pt has had several appts and did not come.  Had appt last week and pt called and cancelled.  I called pt but only got his voice mail and left message that he will need to make appt with dr Lavera Guise, we will not refill b/p med.  If he is having sx he could go to acute care [facility] if he is not going to see dr Lavera Guise in near future.

## 2014-09-15 NOTE — Telephone Encounter (Signed)
-----   Message from [place] sent at 09/14/2014  1:56 PM EDT ----- Regarding: refill Contact: [phone] Patient stated he is dizzy and almost falling when he stands, needs a refill of his blood pressure medicine hydrochlorothizide called into [facility] ASAP. Please call patient back once script is called in Cb# [phone].

## 2014-09-24 ENCOUNTER — Inpatient Hospital Stay: Payer: Medicare Other | Attending: Internal Medicine

## 2014-09-24 DIAGNOSIS — Z85810 Personal history of malignant neoplasm of tongue: Secondary | ICD-10-CM | POA: Insufficient documentation

## 2014-09-24 DIAGNOSIS — Z452 Encounter for adjustment and management of vascular access device: Secondary | ICD-10-CM | POA: Diagnosis not present

## 2014-09-24 DIAGNOSIS — Z9221 Personal history of antineoplastic chemotherapy: Secondary | ICD-10-CM | POA: Diagnosis not present

## 2014-09-24 DIAGNOSIS — C801 Malignant (primary) neoplasm, unspecified: Secondary | ICD-10-CM

## 2014-09-24 MED ORDER — SODIUM CHLORIDE 0.9 % IJ SOLN
10.0000 mL | INTRAMUSCULAR | Status: DC | PRN
  Administered 2014-09-24: 10 mL via INTRAVENOUS
  Filled 2014-09-24: qty 10

## 2014-09-24 MED ORDER — HEPARIN SOD (PORK) LOCK FLUSH 100 UNIT/ML IV SOLN
500.0000 [IU] | Freq: Once | INTRAVENOUS | Status: AC
  Administered 2014-09-24: 500 [IU] via INTRAVENOUS

## 2014-09-24 MED ORDER — HEPARIN SOD (PORK) LOCK FLUSH 100 UNIT/ML IV SOLN
INTRAVENOUS | Status: AC
  Filled 2014-09-24: qty 5

## 2014-10-16 ENCOUNTER — Other Ambulatory Visit: Payer: Self-pay | Admitting: Internal Medicine

## 2014-11-05 ENCOUNTER — Inpatient Hospital Stay: Payer: Medicare Other | Attending: Internal Medicine

## 2014-11-05 ENCOUNTER — Telehealth: Payer: Self-pay | Admitting: *Deleted

## 2014-11-05 DIAGNOSIS — Z9221 Personal history of antineoplastic chemotherapy: Secondary | ICD-10-CM | POA: Insufficient documentation

## 2014-11-05 DIAGNOSIS — C801 Malignant (primary) neoplasm, unspecified: Secondary | ICD-10-CM

## 2014-11-05 DIAGNOSIS — Z85810 Personal history of malignant neoplasm of tongue: Secondary | ICD-10-CM | POA: Insufficient documentation

## 2014-11-05 MED ORDER — HEPARIN SOD (PORK) LOCK FLUSH 100 UNIT/ML IV SOLN
500.0000 [IU] | Freq: Once | INTRAVENOUS | Status: AC
  Administered 2014-11-05: 500 [IU] via INTRAVENOUS
  Filled 2014-11-05: qty 5

## 2014-11-05 MED ORDER — SODIUM CHLORIDE 0.9 % IJ SOLN
10.0000 mL | Freq: Once | INTRAMUSCULAR | Status: DC
  Filled 2014-11-05: qty 10

## 2014-11-05 MED ORDER — SODIUM CHLORIDE 0.9 % IJ SOLN
10.0000 mL | INTRAMUSCULAR | Status: DC | PRN
  Administered 2014-11-05: 10 mL via INTRAVENOUS
  Filled 2014-11-05: qty 10

## 2014-11-05 MED ORDER — AMOXICILLIN-POT CLAVULANATE 875-125 MG PO TABS: 1.0000 | ORAL_TABLET | Freq: Two times a day (BID) | ORAL | Status: DC

## 2014-11-05 MED ORDER — HEPARIN SOD (PORK) LOCK FLUSH 100 UNIT/ML IV SOLN: 500.0000 [IU] | Freq: Once | INTRAVENOUS | Status: DC

## 2014-11-05 NOTE — Telephone Encounter (Signed)
Pt in office for port flush and c/o facial swelling. His right jaw is obviously swollen. After discussing on phone with Dr Ma Hillock, his was advised to see a dentist and informed [facility] [place]. Pt insisted that he is NOT going to see a dentist, it is not an abcess, but he will take the abx. He was then asking for pain med and I told him he would need to see a dentist or go to an urgent care facility

## 2014-11-06 ENCOUNTER — Inpatient Hospital Stay: Payer: Medicare Other | Attending: Internal Medicine | Admitting: Internal Medicine

## 2014-12-04 ENCOUNTER — Other Ambulatory Visit: Payer: Medicare Other

## 2014-12-04 ENCOUNTER — Ambulatory Visit: Payer: Medicare Other | Admitting: Internal Medicine

## 2014-12-09 ENCOUNTER — Other Ambulatory Visit: Payer: Self-pay | Admitting: *Deleted

## 2014-12-09 ENCOUNTER — Inpatient Hospital Stay: Payer: Medicare Other

## 2014-12-09 ENCOUNTER — Telehealth: Payer: Self-pay | Admitting: *Deleted

## 2014-12-09 ENCOUNTER — Inpatient Hospital Stay: Payer: Medicare Other | Attending: Internal Medicine

## 2014-12-09 ENCOUNTER — Inpatient Hospital Stay (HOSPITAL_BASED_OUTPATIENT_CLINIC_OR_DEPARTMENT_OTHER): Payer: Medicare Other | Admitting: Internal Medicine

## 2014-12-09 VITALS — BP 178/100 | HR 54 | Temp 96.9°F | Resp 18 | Ht 72.0 in | Wt 161.4 lb

## 2014-12-09 DIAGNOSIS — G8929 Other chronic pain: Secondary | ICD-10-CM

## 2014-12-09 DIAGNOSIS — C029 Malignant neoplasm of tongue, unspecified: Secondary | ICD-10-CM

## 2014-12-09 DIAGNOSIS — Z9221 Personal history of antineoplastic chemotherapy: Secondary | ICD-10-CM | POA: Insufficient documentation

## 2014-12-09 DIAGNOSIS — R42 Dizziness and giddiness: Secondary | ICD-10-CM | POA: Insufficient documentation

## 2014-12-09 DIAGNOSIS — Z79899 Other long term (current) drug therapy: Secondary | ICD-10-CM | POA: Insufficient documentation

## 2014-12-09 DIAGNOSIS — K047 Periapical abscess without sinus: Secondary | ICD-10-CM | POA: Diagnosis not present

## 2014-12-09 DIAGNOSIS — R6884 Jaw pain: Secondary | ICD-10-CM | POA: Diagnosis not present

## 2014-12-09 DIAGNOSIS — Z87891 Personal history of nicotine dependence: Secondary | ICD-10-CM | POA: Diagnosis not present

## 2014-12-09 DIAGNOSIS — Z923 Personal history of irradiation: Secondary | ICD-10-CM | POA: Diagnosis not present

## 2014-12-09 DIAGNOSIS — I10 Essential (primary) hypertension: Secondary | ICD-10-CM

## 2014-12-09 DIAGNOSIS — Z85810 Personal history of malignant neoplasm of tongue: Secondary | ICD-10-CM | POA: Insufficient documentation

## 2014-12-09 DIAGNOSIS — R070 Pain in throat: Secondary | ICD-10-CM | POA: Insufficient documentation

## 2014-12-09 DIAGNOSIS — C01 Malignant neoplasm of base of tongue: Secondary | ICD-10-CM

## 2014-12-09 LAB — CBC WITH DIFFERENTIAL/PLATELET
BASOS PCT: 1 %
Basophils Absolute: 0.1 10*3/uL (ref 0–0.1)
EOS PCT: 6 %
Eosinophils Absolute: 0.3 10*3/uL (ref 0–0.7)
HCT: 40.4 % (ref 40.0–52.0)
HEMOGLOBIN: 13.5 g/dL (ref 13.0–18.0)
Lymphocytes Relative: 20 %
Lymphs Abs: 1 10*3/uL (ref 1.0–3.6)
MCH: 33 pg (ref 26.0–34.0)
MCHC: 33.4 g/dL (ref 32.0–36.0)
MCV: 98.8 fL (ref 80.0–100.0)
Monocytes Absolute: 0.6 10*3/uL (ref 0.2–1.0)
Monocytes Relative: 12 %
Neutro Abs: 3 10*3/uL (ref 1.4–6.5)
Neutrophils Relative %: 61 %
Platelets: 206 10*3/uL (ref 150–440)
RBC: 4.09 MIL/uL — ABNORMAL LOW (ref 4.40–5.90)
RDW: 15.3 % — ABNORMAL HIGH (ref 11.5–14.5)
WBC: 4.9 10*3/uL (ref 3.8–10.6)

## 2014-12-09 LAB — COMPREHENSIVE METABOLIC PANEL
ALT: 7 U/L — ABNORMAL LOW (ref 17–63)
AST: 14 U/L — ABNORMAL LOW (ref 15–41)
Albumin: 3.9 g/dL (ref 3.5–5.0)
Alkaline Phosphatase: 71 U/L (ref 38–126)
Anion gap: 5 (ref 5–15)
BILIRUBIN TOTAL: 0.6 mg/dL (ref 0.3–1.2)
BUN: 14 mg/dL (ref 6–20)
CHLORIDE: 104 mmol/L (ref 101–111)
CO2: 30 mmol/L (ref 22–32)
Calcium: 8.6 mg/dL — ABNORMAL LOW (ref 8.9–10.3)
Creatinine, Ser: 1.29 mg/dL — ABNORMAL HIGH (ref 0.61–1.24)
GFR calc Af Amer: >60 mL/min (ref >60)
GFR calc non Af Amer: 55 mL/min — ABNORMAL LOW (ref >60)
Glucose, Bld: 94 mg/dL (ref 65–99)
POTASSIUM: 3.6 mmol/L (ref 3.5–5.1)
Sodium: 139 mmol/L (ref 135–145)
Total Protein: 7.5 g/dL (ref 6.5–8.1)

## 2014-12-09 MED ORDER — MECLIZINE HCL 25 MG PO TABS: 25.0000 mg | ORAL_TABLET | Freq: Three times a day (TID) | ORAL | Status: DC | PRN

## 2014-12-09 NOTE — Progress Notes (Signed)
Patient states that overall he has been feeling pretty good. He states that the antibiotic that we gave him for his mouth helped a lot and he is not having pain at this time. He states that he is able to eat pretty good.

## 2014-12-09 NOTE — Telephone Encounter (Signed)
Called in meclizine 25 mg tid as needed for dizziness, #90 and 1 refill, refill of Augmentin bid x 7 days. [facility] and pt will go by and pick up.

## 2014-12-11 ENCOUNTER — Other Ambulatory Visit: Payer: Self-pay | Admitting: Internal Medicine

## 2014-12-11 ENCOUNTER — Ambulatory Visit
Admission: RE | Admit: 2014-12-11 | Discharge: 2014-12-11 | Disposition: A | Discharge status: Home / Self Care | Payer: Medicare Other | Source: Ambulatory Visit | Attending: Internal Medicine | Admitting: Internal Medicine

## 2014-12-11 DIAGNOSIS — I700 Atherosclerosis of aorta: Secondary | ICD-10-CM | POA: Diagnosis not present

## 2014-12-11 DIAGNOSIS — C01 Malignant neoplasm of base of tongue: Secondary | ICD-10-CM

## 2014-12-11 DIAGNOSIS — R911 Solitary pulmonary nodule: Secondary | ICD-10-CM | POA: Diagnosis not present

## 2014-12-11 HISTORY — DX: Essential (primary) hypertension: I10

## 2014-12-11 HISTORY — DX: Malignant (primary) neoplasm, unspecified: C80.1

## 2014-12-11 MED ORDER — IOHEXOL 350 MG/ML SOLN
100.0000 mL | Freq: Once | INTRAVENOUS | Status: AC | PRN
  Administered 2014-12-11: 100 mL via INTRAVENOUS

## 2014-12-15 ENCOUNTER — Telehealth: Payer: Self-pay | Admitting: *Deleted

## 2014-12-15 NOTE — Telephone Encounter (Signed)
Called pt per pandit request and let him know that head and neck cancer does not see any abnl. With scan.  The radiologist did see nodule in rul of lung and dr pandit wants to do pet scan on Thursday 8/11 11:30 and Lucianne Lei will pick pt up 10:30. Went over special instructions and pt so happy to have a good scan about head neck cancer and he will have pet scan to check out his nodule.

## 2014-12-17 ENCOUNTER — Ambulatory Visit
Admission: RE | Admit: 2014-12-17 | Discharge: 2014-12-17 | Disposition: A | Discharge status: Home / Self Care | Payer: Medicare Other | Source: Ambulatory Visit | Attending: Internal Medicine | Admitting: Internal Medicine

## 2014-12-17 DIAGNOSIS — Z79899 Other long term (current) drug therapy: Secondary | ICD-10-CM | POA: Diagnosis not present

## 2014-12-17 DIAGNOSIS — R911 Solitary pulmonary nodule: Secondary | ICD-10-CM | POA: Diagnosis present

## 2014-12-17 LAB — GLUCOSE, CAPILLARY: Glucose-Capillary: 72 mg/dL (ref 65–99)

## 2014-12-17 MED ORDER — FLUDEOXYGLUCOSE F - 18 (FDG) INJECTION
12.1300 | Freq: Once | INTRAVENOUS | Status: DC | PRN
  Administered 2014-12-17: 12.13 via INTRAVENOUS
  Filled 2014-12-17: qty 12.13

## 2014-12-17 NOTE — Progress Notes (Signed)
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 Patient recommended induction chemotherapy followed by chemoradiation by head and neck tumor board at [facility]. Status post cycle 1 induction chemo with taxotere/CDDP/5FU. Course complicated by febrile neutropenia, mucositis, renal insufficiency requring prolonged hospitalisation. Completed cycle 2 chemo with Carboplatin/5FU on 12/26/11. Got weekly cisplatin treatment on 01/30/12 - 03/12/12, along with concurrent radiation   HISTORY OF PRESENT ILLNESS:  Patient returns for continued oncology evaluation, he was last seen in Jan 2016. He c/o intermittent right jaw pain, waiting dental followup, wants antibx for tooth infection. States that he continues to have intermittent vertigo but overall feels better. Otherwise denies any loss of consciousness, seizures, or weakness in his legs. Otherwise remains physically active. He has chronic intermittent neck and throat pain. Denies any new sore throat or difficulty in swallowing. Eating well. No nausea or vomiting. No diarrhea. No new bone pains.    REVIEW OF SYSTEMS:   ROS As in HPI above. In addition, no fever, chills or sweats. No new headaches or focal weakness.  No sore throat, cough, shortness of breath, sputum, hemoptysis or chest pain. No dizziness or palpitation. No abdominal pain, constipation, diarrhea, dysuria or hematuria. No new skin rash or bleeding symptoms. No new paresthesias in extremities. PS ECOG 1.  PAST MEDICAL HISTORY: Reviewed. Past Medical History  Diagnosis Date  . Hypertension   . Cancer head and  neck  Advanced squamous cell carcinoma of the right base of the tongue diagnosed May 2013 as above  PAST SURGICAL HISTORY: Reviewed. As above.  FAMILY HISTORY: Reviewed. Denies malignancy or hematological disorders.  SOCIAL HISTORY: Reviewed. Has 15-pack-year smoking history, currently quit.  Denies recent alcohol intake, states that he was a heavy drinker in the past.  Denies recreational drug usage.     Allergies not on file  Current Outpatient Prescriptions  Medication Sig Dispense Refill  . amoxicillin-clavulanate (AUGMENTIN) 875-125 MG per tablet Take 1 tablet by mouth 2 (two) times daily. 14 tablet 0  . gabapentin (NEURONTIN) 300 MG capsule Take 300 mg by mouth at bedtime.  1  . hydrochlorothiazide (HYDRODIURIL) 25 MG tablet Take 25 mg by mouth daily.  5  . meclizine (ANTIVERT) 25 MG tablet Take 1 tablet (25 mg total) by mouth 3 (three) times daily as needed for dizziness. 90 tablet 1  . permethrin (ELIMITE) 5 % cream APPLY FROM NECK TO FEET AT NIGHT AND THEN WASH OFF IN THE MORNING. REPEAT AFTER 1 WEEK.  1   No current facility-administered medications for this visit.   Facility-Administered Medications Ordered in Other Visits  Medication Dose Route Frequency Provider Last Rate Last Dose  . fludeoxyglucose F - 18 (FDG) injection 12.13 milli Curie  12.13 milli Curie Intravenous Once PRN Medication Radiologist, MD   12.13 milli Curie at 12/17/14 1123    PHYSICAL EXAM: Filed Vitals:   12/09/14 1152  BP: 178/100  Pulse:   Temp:   Resp:      Body mass index is 21.88 kg/(m^2).    ECOG FS:1 - Symptomatic but completely ambulatory  GENERAL: Patient is alert and oriented and in no acute distress. There  is no icterus. HEENT: EOMs intact. Oral exam negative for thrush or lesions. No cervical lymphadenopathy. CVS: S1S2, regular LUNGS: Bilaterally clear to auscultation, no rhonchi. ABDOMEN: Soft, nontender. No hepatomegaly clinically.  NEURO: grossly nonfocal, cranial nerves are  intact.   EXTREMITIES: No pedal edema.   LAB RESULTS:    Component Value Date/Time   NA 139 12/09/2014 1047   NA 144 06/05/2014 1011   K 3.6 12/09/2014 1047   K 3.7 06/05/2014 1011   CL 104 12/09/2014 1047   CL 104 06/05/2014 1011   CO2 30 12/09/2014 1047   CO2 32 06/05/2014 1011   GLUCOSE 94 12/09/2014 1047   GLUCOSE 85 06/05/2014 1011   BUN 14 12/09/2014 1047   BUN 11 06/05/2014 1011   CREATININE 1.29* 12/09/2014 1047   CREATININE 1.26 06/05/2014 1011   CALCIUM 8.6* 12/09/2014 1047   CALCIUM 9.1 06/05/2014 1011   PROT 7.5 12/09/2014 1047   PROT 7.7 06/05/2014 1011   ALBUMIN 3.9 12/09/2014 1047   ALBUMIN 3.6 06/05/2014 1011   AST 14* 12/09/2014 1047   AST 16 06/05/2014 1011   ALT 7* 12/09/2014 1047   ALT 7* 06/05/2014 1011   ALKPHOS 71 12/09/2014 1047   ALKPHOS 84 06/05/2014 1011   BILITOT 0.6 12/09/2014 1047   BILITOT 0.6 06/05/2014 1011   GFRNONAA 55* 12/09/2014 1047   GFRNONAA 50* 12/03/2013 1346   GFRAA >60 12/09/2014 1047   GFRAA 58* 12/03/2013 1346    Lab Results  Component Value Date   WBC 4.9 12/09/2014   NEUTROABS 3.0 12/09/2014   HGB 13.5 12/09/2014   HCT 40.4 12/09/2014   MCV 98.8 12/09/2014   PLT 206 12/09/2014    STUDIES: 05/22/14 - CT head. IMPRESSION: 1. No acute or metastatic intracranial abnormality. 2. 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  12/03/13 - CT neck. IMPRESSION: 1. Progressive edematous postradiation changes at the base of tongue and suprahyoid neck without a focal soft tissue mass to suggest residual or recurrent tumor.  2. This does result in some narrowing of the supraglottic airway. If the patient is experiencing stridor, endoscopy may be useful further evaluation.  3. No significant adenopathy.  4. Moderate spondylosis of the cervical spine is similar to the prior study.  ASSESSMENT / PLAN:   1. 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  He is status post chemoradiation as described above  -  Have reviewed labs from today and discussed with patient. Given intermittent mouth pain, will get repeat CT neck to rule out recurrent head and neck cancer. Patient is eating well and has gained some weight back. If CT scan is negative for recurrent/metastatic malignancy, then plan is to continue surveillance, and we will see him back in 6 months with repeat CBC, met-B, LFT. 2. Vertigo - Patient states that he has appointment with ENT coming up in the near future. He is on meclizine t.i.d. p.r.n. for this. 3. Dental issues - will prescribe empiric antibiotic, states he will see dentist soon. 4. In between visits, patient advised to call or come to ER in case of any worsening symptoms or acute sickness. He is agreeable to this plan.   Leia Alf, MD   12/17/2014 11:19 PM

## 2014-12-18 ENCOUNTER — Other Ambulatory Visit: Payer: Self-pay | Admitting: *Deleted

## 2014-12-18 DIAGNOSIS — R942 Abnormal results of pulmonary function studies: Secondary | ICD-10-CM

## 2014-12-25 ENCOUNTER — Other Ambulatory Visit: Payer: Self-pay | Admitting: Physician Assistant

## 2014-12-28 ENCOUNTER — Ambulatory Visit
Admission: RE | Admit: 2014-12-28 | Discharge: 2014-12-28 | Disposition: A | Discharge status: Home / Self Care | Payer: Medicare Other | Source: Ambulatory Visit | Attending: Interventional Radiology | Admitting: Interventional Radiology

## 2014-12-28 ENCOUNTER — Ambulatory Visit
Admission: RE | Admit: 2014-12-28 | Discharge: 2014-12-28 | Disposition: A | Discharge status: Home / Self Care | Payer: Medicare Other | Source: Ambulatory Visit | Attending: Internal Medicine | Admitting: Internal Medicine

## 2014-12-28 DIAGNOSIS — Z9889 Other specified postprocedural states: Secondary | ICD-10-CM

## 2014-12-28 DIAGNOSIS — R942 Abnormal results of pulmonary function studies: Secondary | ICD-10-CM | POA: Insufficient documentation

## 2014-12-28 DIAGNOSIS — R911 Solitary pulmonary nodule: Secondary | ICD-10-CM | POA: Diagnosis present

## 2014-12-28 DIAGNOSIS — C3491 Malignant neoplasm of unspecified part of right bronchus or lung: Secondary | ICD-10-CM

## 2014-12-28 HISTORY — DX: Malignant neoplasm of unspecified part of right bronchus or lung: C34.91

## 2014-12-28 HISTORY — DX: Gastro-esophageal reflux disease without esophagitis: K21.9

## 2014-12-28 LAB — APTT: APTT: 32 s (ref 24–36)

## 2014-12-28 LAB — CBC
HEMATOCRIT: 39.9 % — AB (ref 40.0–52.0)
Hemoglobin: 13.1 g/dL (ref 13.0–18.0)
MCH: 33.4 pg (ref 26.0–34.0)
MCHC: 32.9 g/dL (ref 32.0–36.0)
MCV: 101.5 fL — AB (ref 80.0–100.0)
PLATELETS: 163 10*3/uL (ref 150–440)
RBC: 3.93 MIL/uL — ABNORMAL LOW (ref 4.40–5.90)
RDW: 16.8 % — AB (ref 11.5–14.5)
WBC: 5.7 10*3/uL (ref 3.8–10.6)

## 2014-12-28 LAB — PROTIME-INR
INR: 0.98
Prothrombin Time: 13.2 seconds (ref 11.4–15.0)

## 2014-12-28 MED ORDER — SODIUM CHLORIDE 0.9 % IV SOLN
INTRAVENOUS | Status: DC
  Administered 2014-12-28: 10:00:00 via INTRAVENOUS

## 2014-12-28 MED ORDER — SODIUM CHLORIDE 0.9 % IJ SOLN
INTRAMUSCULAR | Status: AC
  Filled 2014-12-28: qty 3

## 2014-12-28 MED ORDER — MIDAZOLAM HCL 2 MG/2ML IJ SOLN
INTRAMUSCULAR | Status: AC | PRN
  Administered 2014-12-28 (×2): 0.5 mg via INTRAVENOUS

## 2014-12-28 MED ORDER — MIDAZOLAM HCL 5 MG/5ML IJ SOLN
INTRAMUSCULAR | Status: AC
  Filled 2014-12-28: qty 5

## 2014-12-28 MED ORDER — BUTAMBEN-TETRACAINE-BENZOCAINE 2-2-14 % EX AERO
INHALATION_SPRAY | CUTANEOUS | Status: AC
  Filled 2014-12-28: qty 20

## 2014-12-28 MED ORDER — FENTANYL CITRATE (PF) 100 MCG/2ML IJ SOLN
INTRAMUSCULAR | Status: AC
  Filled 2014-12-28: qty 2

## 2014-12-28 MED ORDER — FENTANYL CITRATE (PF) 100 MCG/2ML IJ SOLN
INTRAMUSCULAR | Status: AC | PRN
  Administered 2014-12-28 (×2): 25 ug via INTRAVENOUS

## 2014-12-28 MED ORDER — FENTANYL CITRATE (PF) 100 MCG/2ML IJ SOLN
25.0000 ug | Freq: Once | INTRAMUSCULAR | Status: AC
  Administered 2014-12-28: 25 ug via INTRAVENOUS

## 2014-12-28 MED ORDER — HEPARIN SOD (PORK) LOCK FLUSH 100 UNIT/ML IV SOLN
INTRAVENOUS | Status: DC
Start: 2014-12-28 — End: 2014-12-29
  Filled 2014-12-28: qty 5
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
## 2014-12-28 NOTE — Discharge Instructions (Signed)
°  Document Released: 11/11/2004 Document Revised: 07/17/2011 Document Reviewed: 10/20/2010 [facility] Patient Information 2015 [place], [place]. This information is not intended to replace advice given to you by your health care provider. Make sure you discuss any questions you have with your health care provider.

## 2014-12-28 NOTE — OR Nursing (Signed)
Caryl Pina from Dr [facility] office was notified of [place] on pt. She will inform MD.

## 2014-12-28 NOTE — OR Nursing (Signed)
md noted small pneumothorax post ct biopsy, during final Post CT, Pt to stay for another scan in 10 minutes to verify no increase in size. Pt denies symptoms.

## 2014-12-28 NOTE — Procedures (Signed)
Technically successful CT guided biopsy of hypermetabolic right upper lobe pulmonary nodule Procedural complicated by development of a tiny, non-enlarging and asymptomatic pneumothorax which will be followed with serial CXRs.

## 2014-12-28 NOTE — OR Nursing (Signed)
Portable chest xray done, awaiting results

## 2014-12-28 NOTE — OR Nursing (Signed)
Pt aggitated, crawling out of bed insisting that he needs to go to bathroom, he insisted getting dressed. Port a cath [employer]. Dr Pascal Lux informed of pt declaration of he is going to leave now. Pt refused to sign against medical advice form. Able to get vital signs at 1430. But no cardiac monitor on. Pt delayed leaving due to his ride not here yet.

## 2014-12-28 NOTE — OR Nursing (Signed)
7 beat run of V TACh noted on cardiac monitor. Pt denies symptoms, Dr Pascal Lux notified. Continue to monitor. Pt reports history of vertigo which typically lasts about a minute. Port a cath accessed. Unable to aspirate but flushes without resistance.

## 2014-12-28 NOTE — OR Nursing (Signed)
Unchanged pneumothorax, pt informed

## 2014-12-30 LAB — SURGICAL PATHOLOGY

## 2015-01-01 ENCOUNTER — Telehealth: Payer: Self-pay | Admitting: *Deleted

## 2015-01-01 ENCOUNTER — Inpatient Hospital Stay: Payer: Medicare Other | Admitting: Internal Medicine

## 2015-01-01 ENCOUNTER — Other Ambulatory Visit: Payer: Self-pay | Admitting: Internal Medicine

## 2015-01-01 NOTE — Progress Notes (Signed)
Lung biopsy reports squamous cell carcinoma. Have discussed with patient over the phone today and given solitary area of malignancy based on PET scan evaluation, have discussed with thoracic surgeon Dr.Oaks who plans to see patient in one week and discuss possible surgical resection of this lung nodule (?Second primary localized lung cancer versus isolated metastasis from known history of head and neck cancer). Patient is agreeable to meet with Dr.Oaks next week and does not see the need to come for appointment today. He was encouraged to call in between if he has any additional questions or concerns. He is agreeable to this plan.

## 2015-01-01 NOTE — Telephone Encounter (Signed)
Dr. Ma Hillock spoke to Dr. Genevive Bi about pt lung cancer dx and Dr. Genevive Bi feels like pt would be a good candidate for surgery based on size of cancer and limited to one spot in lung.  Dr. Ma Hillock called pt and let him know about results and what the results were and has planned an appt to see Dr. Genevive Bi next Friday 9/2 10 am for [facility] arrival and 11 am for [facility] to see and also told pt over the phone that pandit will be here on that day and come and speak to him more if he has questions. Pt agreeable to plan and [facility] cancelled for today and the md visit

## 2015-01-08 ENCOUNTER — Inpatient Hospital Stay: Payer: Medicare Other | Attending: Cardiothoracic Surgery | Admitting: Cardiothoracic Surgery

## 2015-01-08 ENCOUNTER — Ambulatory Visit: Payer: Medicare Other | Admitting: Cardiothoracic Surgery

## 2015-01-08 ENCOUNTER — Encounter: Payer: Self-pay | Admitting: Cardiothoracic Surgery

## 2015-01-08 ENCOUNTER — Inpatient Hospital Stay: Payer: Medicare Other

## 2015-01-08 ENCOUNTER — Ambulatory Visit
Admission: RE | Admit: 2015-01-08 | Discharge: 2015-01-08 | Disposition: A | Discharge status: Home / Self Care | Payer: Medicare Other | Source: Ambulatory Visit | Attending: Cardiothoracic Surgery | Admitting: Cardiothoracic Surgery

## 2015-01-08 ENCOUNTER — Other Ambulatory Visit: Payer: Self-pay | Admitting: *Deleted

## 2015-01-08 VITALS — BP 147/90 | HR 55 | Temp 97.0°F | Resp 18 | Ht 72.0 in | Wt 162.4 lb

## 2015-01-08 DIAGNOSIS — R942 Abnormal results of pulmonary function studies: Secondary | ICD-10-CM

## 2015-01-08 DIAGNOSIS — Z7982 Long term (current) use of aspirin: Secondary | ICD-10-CM | POA: Insufficient documentation

## 2015-01-08 DIAGNOSIS — R42 Dizziness and giddiness: Secondary | ICD-10-CM | POA: Diagnosis not present

## 2015-01-08 DIAGNOSIS — Z79899 Other long term (current) drug therapy: Secondary | ICD-10-CM | POA: Diagnosis not present

## 2015-01-08 DIAGNOSIS — Z85810 Personal history of malignant neoplasm of tongue: Secondary | ICD-10-CM | POA: Diagnosis not present

## 2015-01-08 DIAGNOSIS — C3411 Malignant neoplasm of upper lobe, right bronchus or lung: Secondary | ICD-10-CM

## 2015-01-08 DIAGNOSIS — F17210 Nicotine dependence, cigarettes, uncomplicated: Secondary | ICD-10-CM | POA: Insufficient documentation

## 2015-01-08 DIAGNOSIS — R630 Anorexia: Secondary | ICD-10-CM | POA: Insufficient documentation

## 2015-01-08 DIAGNOSIS — I252 Old myocardial infarction: Secondary | ICD-10-CM | POA: Diagnosis not present

## 2015-01-08 DIAGNOSIS — R070 Pain in throat: Secondary | ICD-10-CM | POA: Insufficient documentation

## 2015-01-08 DIAGNOSIS — K088 Other specified disorders of teeth and supporting structures: Secondary | ICD-10-CM | POA: Insufficient documentation

## 2015-01-08 DIAGNOSIS — K219 Gastro-esophageal reflux disease without esophagitis: Secondary | ICD-10-CM | POA: Diagnosis not present

## 2015-01-08 DIAGNOSIS — I10 Essential (primary) hypertension: Secondary | ICD-10-CM | POA: Diagnosis not present

## 2015-01-08 LAB — CBC WITH DIFFERENTIAL/PLATELET
Basophils Absolute: 0.1 10*3/uL (ref 0–0.1)
Basophils Relative: 1 %
EOS PCT: 4 %
Eosinophils Absolute: 0.2 10*3/uL (ref 0–0.7)
HEMATOCRIT: 42.5 % (ref 40.0–52.0)
Hemoglobin: 14.2 g/dL (ref 13.0–18.0)
LYMPHS ABS: 0.6 10*3/uL — AB (ref 1.0–3.6)
LYMPHS PCT: 11 %
MCH: 33.5 pg (ref 26.0–34.0)
MCHC: 33.5 g/dL (ref 32.0–36.0)
MCV: 99.9 fL (ref 80.0–100.0)
MONO ABS: 0.6 10*3/uL (ref 0.2–1.0)
MONOS PCT: 11 %
Neutro Abs: 3.9 10*3/uL (ref 1.4–6.5)
Neutrophils Relative %: 73 %
PLATELETS: 232 10*3/uL (ref 150–440)
RBC: 4.25 MIL/uL — ABNORMAL LOW (ref 4.40–5.90)
RDW: 16.7 % — AB (ref 11.5–14.5)
WBC: 5.4 10*3/uL (ref 3.8–10.6)

## 2015-01-08 LAB — COMPREHENSIVE METABOLIC PANEL
ALT: 7 U/L — ABNORMAL LOW (ref 17–63)
AST: 17 U/L (ref 15–41)
Albumin: 3.9 g/dL (ref 3.5–5.0)
Alkaline Phosphatase: 66 U/L (ref 38–126)
Anion gap: 5 (ref 5–15)
BILIRUBIN TOTAL: 0.8 mg/dL (ref 0.3–1.2)
BUN: 19 mg/dL (ref 6–20)
CHLORIDE: 103 mmol/L (ref 101–111)
CO2: 31 mmol/L (ref 22–32)
CREATININE: 1.26 mg/dL — AB (ref 0.61–1.24)
Calcium: 9.3 mg/dL (ref 8.9–10.3)
GFR, EST NON AFRICAN AMERICAN: 56 mL/min — AB (ref >60)
Glucose, Bld: 105 mg/dL — ABNORMAL HIGH (ref 65–99)
POTASSIUM: 3.5 mmol/L (ref 3.5–5.1)
Sodium: 139 mmol/L (ref 135–145)
TOTAL PROTEIN: 7.7 g/dL (ref 6.5–8.1)

## 2015-01-08 LAB — APTT: aPTT: 34 seconds (ref 24–36)

## 2015-01-08 LAB — PROTIME-INR
INR: 1.03
Prothrombin Time: 13.7 seconds (ref 11.4–15.0)
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
## 2015-01-12 ENCOUNTER — Other Ambulatory Visit: Payer: Self-pay | Admitting: Cardiothoracic Surgery

## 2015-01-12 ENCOUNTER — Ambulatory Visit (INDEPENDENT_AMBULATORY_CARE_PROVIDER_SITE_OTHER): Payer: Medicare Other | Admitting: Cardiovascular Disease

## 2015-01-12 ENCOUNTER — Ambulatory Visit: Payer: Medicare Other | Attending: Cardiothoracic Surgery

## 2015-01-12 ENCOUNTER — Telehealth: Payer: Self-pay | Admitting: Cardiothoracic Surgery

## 2015-01-12 ENCOUNTER — Other Ambulatory Visit: Payer: Self-pay | Admitting: *Deleted

## 2015-01-12 ENCOUNTER — Encounter: Payer: Self-pay | Admitting: Cardiovascular Disease

## 2015-01-12 VITALS — BP 154/92 | HR 60 | Ht 72.0 in | Wt 155.5 lb

## 2015-01-12 DIAGNOSIS — C3490 Malignant neoplasm of unspecified part of unspecified bronchus or lung: Secondary | ICD-10-CM | POA: Insufficient documentation

## 2015-01-12 DIAGNOSIS — I10 Essential (primary) hypertension: Secondary | ICD-10-CM | POA: Diagnosis not present

## 2015-01-12 DIAGNOSIS — J988 Other specified respiratory disorders: Secondary | ICD-10-CM | POA: Diagnosis not present

## 2015-01-12 DIAGNOSIS — Z01810 Encounter for preprocedural cardiovascular examination: Secondary | ICD-10-CM | POA: Diagnosis not present

## 2015-01-12 DIAGNOSIS — R948 Abnormal results of function studies of other organs and systems: Secondary | ICD-10-CM | POA: Diagnosis present

## 2015-01-12 DIAGNOSIS — R9431 Abnormal electrocardiogram [ECG] [EKG]: Secondary | ICD-10-CM

## 2015-01-12 DIAGNOSIS — R942 Abnormal results of pulmonary function studies: Secondary | ICD-10-CM

## 2015-01-12 LAB — BLOOD GAS, ARTERIAL
ACID-BASE EXCESS: 3.2 mmol/L — AB (ref 0.0–3.0)
Allens test (pass/fail): POSITIVE — AB
BICARBONATE: 26.9 meq/L (ref 21.0–28.0)
FIO2: 21
O2 Saturation: 94.7 %
PATIENT TEMPERATURE: 37
PH ART: 7.47 — AB (ref 7.350–7.450)
pCO2 arterial: 37 mmHg (ref 32.0–48.0)
pO2, Arterial: 69 mmHg — ABNORMAL LOW (ref 83.0–108.0)

## 2015-01-12 MED ORDER — ALBUTEROL SULFATE (2.5 MG/3ML) 0.083% IN NEBU: 2.5000 mg | INHALATION_SOLUTION | Freq: Once | RESPIRATORY_TRACT | Status: DC

## 2015-01-12 NOTE — Assessment & Plan Note (Signed)
He reports that his blood pressure is always higher at the physician's office. He also did not take hydrochlorothiazide this morning. I made no changes in his medications.

## 2015-01-12 NOTE — Patient Instructions (Signed)
Medication Instructions: No change  Labwork: None.   Procedures/Testing: None.   Follow-Up: As needed  Any Additional Special Instructions Will Be Listed Below (If Applicable). You are at low risk for surgery from a cardiac standpoint.

## 2015-01-12 NOTE — Telephone Encounter (Signed)
Pt advised of pre op date/time and sx date by Renita Papa, RN.  Sx: 01/19/15 with Dr Genevive Bi, Dr Pat Patrick assisting--right thoracotomy with lung resection Pre op: 01/14/15 -- office.

## 2015-01-12 NOTE — Assessment & Plan Note (Signed)
The patient reports previous myocardial infarction in 1982 with no cardiac events since then. Currently he has no symptoms suggestive of angina and has an overall good functional capacity. Although his EKG is abnormal, this does not seem to be different from his prior EKGs dating back to 2008. Thus, I don't feel any further ischemic cardiac evaluation is needed. The patient should be considered at an overall low risk for surgery from a cardiac standpoint.
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
## 2015-01-12 NOTE — Progress Notes (Signed)
Abnormal ekg-needs cardiac clearance asap. Cardiothoracic surgery being planned for Tuesday 01/19/15.

## 2015-01-14 ENCOUNTER — Encounter
Admission: RE | Admit: 2015-01-14 | Discharge: 2015-01-14 | Disposition: A | Discharge status: Home / Self Care | Payer: Medicare Other | Source: Ambulatory Visit | Attending: Cardiothoracic Surgery | Admitting: Cardiothoracic Surgery

## 2015-01-14 DIAGNOSIS — Z01818 Encounter for other preprocedural examination: Secondary | ICD-10-CM | POA: Insufficient documentation

## 2015-01-14 DIAGNOSIS — Z720 Tobacco use: Secondary | ICD-10-CM | POA: Diagnosis not present

## 2015-01-14 DIAGNOSIS — C3491 Malignant neoplasm of unspecified part of right bronchus or lung: Secondary | ICD-10-CM | POA: Diagnosis not present

## 2015-01-14 LAB — SURGICAL PCR SCREEN
MRSA, PCR: NEGATIVE
Staphylococcus aureus: POSITIVE — AB

## 2015-01-14 LAB — ABO/RH: ABO/RH(D): O POS

## 2015-01-14 NOTE — Patient Instructions (Signed)
  Your procedure is scheduled on: Tuesday Sept 13, 2016 Report to Same Day Surgery. To find out your arrival time please call [phone] between 1PM - 3PM on Monday 12, 2016.  Remember: Instructions that are not followed completely may result in serious medical risk, up to and including death, or upon the discretion of your surgeon and anesthesiologist your surgery may need to be rescheduled.    __x__ 1. Do not eat food or drink liquids after midnight. No gum chewing or hard candies.     __x__ 2. No Alcohol for 24 hours before or after surgery.   ____ 3. Bring all medications with you on the day of surgery if instructed.    __x__ 4. Notify your doctor if there is any change in your medical condition     (cold, fever, infections).     Do not wear jewelry, make-up, hairpins, clips or nail polish.  Do not wear lotions, powders, or perfumes. You may wear deodorant.  Do not shave 48 hours prior to surgery. Men may shave face and neck.  Do not bring valuables to the hospital.    [facility] is not responsible for any belongings or valuables.               Contacts, dentures or bridgework may not be worn into surgery.  Leave your suitcase in the car. After surgery it may be brought to your room.  For patients admitted to the hospital, discharge time is determined by your treatment team.   Patients discharged the day of surgery will not be allowed to drive home.    Please read over the following fact sheets that you were given:   [facility] Preparing for Surgery  ____ Take these medicines the morning of surgery with A SIP OF WATER: None    ____ Fleet Enema (as directed)   ____ Use CHG Soap as directed  ____ Use inhalers on the day of surgery  ____ Stop metformin 2 days prior to surgery    ____ Take 1/2 of usual insulin dose the night before surgery and none on the morning of surgery.   _x___ Check with Dr. Genevive Bi tomorrow to see if you need to stop your aspirin prior to  surgery.  _x___ Stop Anti-inflammatories Ibuprofen now.   ____ Stop supplements until after surgery.    ____ Bring C-Pap to the hospital.

## 2015-01-15 ENCOUNTER — Inpatient Hospital Stay (HOSPITAL_BASED_OUTPATIENT_CLINIC_OR_DEPARTMENT_OTHER): Payer: Medicare Other | Admitting: Cardiothoracic Surgery

## 2015-01-15 VITALS — BP 156/102 | HR 82 | Temp 97.0°F | Ht 72.0 in | Wt 153.2 lb

## 2015-01-15 DIAGNOSIS — R918 Other nonspecific abnormal finding of lung field: Secondary | ICD-10-CM

## 2015-01-15 DIAGNOSIS — C3411 Malignant neoplasm of upper lobe, right bronchus or lung: Secondary | ICD-10-CM | POA: Diagnosis not present

## 2015-01-15 NOTE — Progress Notes (Signed)
[facility] Inpatient Post-Op Note  Patient ID: Vernon Hayes, male   DOB: 1946/01/16, 69 y.o.   MRN: [id number]  HISTORY: He returns today in follow-up. He did have an opportunity to meet with the cardiologist felt that he was an acceptable risk for surgery. He's also seen our anesthesia colleagues and comes in today to review his options regarding diagnosis and/or management of his right upper lobe mass. He understands that this most likely represents a metastasis from his prior head and neck surgery. We reviewed with him the options at this point. I again discussed with him the possibility of performing a thoracotomy with wedge resection. He understands that the margins are close or positive he may need additional chemotherapy and/or radiation therapy. His only complaint today is discuss some discomfort in the roof of his mouth. I've looked and see no new lesion present.   Filed Vitals:   01/15/15 0926  BP: 156/102  Pulse: 82  Temp: 97 F (36.1 C)     EXAM: Resp: Lungs are clear bilaterally.  No respiratory distress, normal effort. Heart:  Regular without murmurs Abd:  Abdomen is soft, non distended and non tender. No masses are palpable.  There is no rebound and no guarding.  Neurological: Alert and oriented to person, place, and time. Coordination normal.  Skin: Skin is warm and dry. No rash noted. No diaphoretic. No erythema. No pallor.  Psychiatric: Normal mood and affect. Normal behavior. Judgment and thought content normal.    ASSESSMENT: I did review with him again the indications and risks of right thoracotomy and wedge resection. Risks of bleeding infection air leak and death were all reviewed.   PLAN:   He will come in next week for his surgery. He also understands there may be the need for additional postoperative therapy including radiation therapy and/or chemotherapy.    Nestor Lewandowsky, MD

## 2015-01-15 NOTE — Progress Notes (Signed)
Patient here for surgical consult. Extremely high blood pressure. Patient states he did take his medications this AM.

## 2015-01-18 LAB — PREPARE RBC (CROSSMATCH)

## 2015-01-19 ENCOUNTER — Inpatient Hospital Stay
Admission: RE | Admit: 2015-01-19 | Discharge: 2015-01-22 | DRG: 164 | Disposition: A | Discharge status: Home Health | Payer: Medicare Other | Source: Ambulatory Visit | Attending: Cardiothoracic Surgery | Admitting: Cardiothoracic Surgery

## 2015-01-19 ENCOUNTER — Inpatient Hospital Stay: Payer: Medicare Other | Admitting: Anesthesiology

## 2015-01-19 ENCOUNTER — Encounter: Payer: Self-pay | Admitting: *Deleted

## 2015-01-19 ENCOUNTER — Encounter
Admission: RE | Disposition: A | Discharge status: Home Health | Payer: Self-pay | Source: Ambulatory Visit | Attending: Cardiothoracic Surgery

## 2015-01-19 ENCOUNTER — Inpatient Hospital Stay: Payer: Medicare Other

## 2015-01-19 DIAGNOSIS — C3411 Malignant neoplasm of upper lobe, right bronchus or lung: Secondary | ICD-10-CM | POA: Diagnosis present

## 2015-01-19 DIAGNOSIS — R918 Other nonspecific abnormal finding of lung field: Secondary | ICD-10-CM | POA: Diagnosis present

## 2015-01-19 DIAGNOSIS — F17210 Nicotine dependence, cigarettes, uncomplicated: Secondary | ICD-10-CM | POA: Diagnosis present

## 2015-01-19 DIAGNOSIS — Z6820 Body mass index (BMI) 20.0-20.9, adult: Secondary | ICD-10-CM

## 2015-01-19 DIAGNOSIS — I10 Essential (primary) hypertension: Secondary | ICD-10-CM | POA: Diagnosis not present

## 2015-01-19 DIAGNOSIS — C3491 Malignant neoplasm of unspecified part of right bronchus or lung: Secondary | ICD-10-CM

## 2015-01-19 DIAGNOSIS — Z9689 Presence of other specified functional implants: Secondary | ICD-10-CM

## 2015-01-19 DIAGNOSIS — E440 Moderate protein-calorie malnutrition: Secondary | ICD-10-CM | POA: Diagnosis present

## 2015-01-19 DIAGNOSIS — Z09 Encounter for follow-up examination after completed treatment for conditions other than malignant neoplasm: Secondary | ICD-10-CM

## 2015-01-19 HISTORY — PX: THORACOTOMY: SHX5074

## 2015-01-19 LAB — GLUCOSE, CAPILLARY: Glucose-Capillary: 104 mg/dL — ABNORMAL HIGH (ref 65–99)

## 2015-01-19 LAB — MRSA PCR SCREENING: MRSA by PCR: NEGATIVE

## 2015-01-19 SURGERY — THORACOTOMY, MAJOR
Anesthesia: General | Laterality: Right | Wound class: Clean Contaminated

## 2015-01-19 MED ORDER — NEOSTIGMINE METHYLSULFATE 10 MG/10ML IV SOLN
INTRAVENOUS | Status: DC | PRN
  Administered 2015-01-19: 4 mg via INTRAVENOUS

## 2015-01-19 MED ORDER — ONDANSETRON HCL 4 MG/2ML IJ SOLN: 4.0000 mg | Freq: Once | INTRAMUSCULAR | Status: DC | PRN

## 2015-01-19 MED ORDER — CEFUROXIME SODIUM 1.5 G IJ SOLR
1.5000 g | Freq: Two times a day (BID) | INTRAMUSCULAR | Status: AC
  Administered 2015-01-19 – 2015-01-20 (×2): 1.5 g via INTRAVENOUS
  Filled 2015-01-19 (×2): qty 1.5

## 2015-01-19 MED ORDER — ONDANSETRON HCL 4 MG/2ML IJ SOLN
INTRAMUSCULAR | Status: DC | PRN
  Administered 2015-01-19: 4 mg via INTRAVENOUS

## 2015-01-19 MED ORDER — FAMOTIDINE 20 MG PO TABS
ORAL_TABLET | ORAL | Status: AC
  Administered 2015-01-19: 20 mg via ORAL
  Filled 2015-01-19: qty 1

## 2015-01-19 MED ORDER — DEXAMETHASONE SODIUM PHOSPHATE 4 MG/ML IJ SOLN
INTRAMUSCULAR | Status: DC | PRN
  Administered 2015-01-19: 4 mg via INTRAVENOUS

## 2015-01-19 MED ORDER — HYDROMORPHONE HCL 1 MG/ML IJ SOLN: 0.2000 mg | INTRAMUSCULAR | Status: DC | PRN

## 2015-01-19 MED ORDER — INFLUENZA VAC SPLIT QUAD 0.5 ML IM SUSY: 0.5000 mL | PREFILLED_SYRINGE | INTRAMUSCULAR | Status: DC

## 2015-01-19 MED ORDER — FAMOTIDINE 20 MG PO TABS
20.0000 mg | ORAL_TABLET | Freq: Once | ORAL | Status: AC
  Administered 2015-01-19: 20 mg via ORAL

## 2015-01-19 MED ORDER — ROCURONIUM BROMIDE 100 MG/10ML IV SOLN
INTRAVENOUS | Status: DC | PRN
  Administered 2015-01-19: 50 mg via INTRAVENOUS

## 2015-01-19 MED ORDER — CEFAZOLIN SODIUM-DEXTROSE 2-3 GM-% IV SOLR
INTRAVENOUS | Status: AC
  Administered 2015-01-19: 2 g via INTRAVENOUS
  Filled 2015-01-19: qty 50

## 2015-01-19 MED ORDER — FENTANYL CITRATE (PF) 100 MCG/2ML IJ SOLN
INTRAMUSCULAR | Status: AC
  Administered 2015-01-19: 25 ug via INTRAVENOUS
  Filled 2015-01-19: qty 2

## 2015-01-19 MED ORDER — LIDOCAINE HCL (CARDIAC) 20 MG/ML IV SOLN
INTRAVENOUS | Status: DC | PRN
  Administered 2015-01-19: 30 mg via INTRAVENOUS

## 2015-01-19 MED ORDER — BUPIVACAINE HCL (PF) 0.25 % IJ SOLN
INTRAMUSCULAR | Status: AC
  Filled 2015-01-19: qty 30

## 2015-01-19 MED ORDER — FENTANYL CITRATE (PF) 100 MCG/2ML IJ SOLN
25.0000 ug | INTRAMUSCULAR | Status: AC | PRN
  Administered 2015-01-19 (×2): 25 ug via INTRAVENOUS

## 2015-01-19 MED ORDER — PROPOFOL 10 MG/ML IV BOLUS
INTRAVENOUS | Status: DC | PRN
  Administered 2015-01-19: 150 mg via INTRAVENOUS

## 2015-01-19 MED ORDER — ALBUTEROL SULFATE (2.5 MG/3ML) 0.083% IN NEBU
2.5000 mg | INHALATION_SOLUTION | RESPIRATORY_TRACT | Status: DC
  Administered 2015-01-19 – 2015-01-21 (×5): 2.5 mg via RESPIRATORY_TRACT
  Filled 2015-01-19 (×5): qty 3

## 2015-01-19 MED ORDER — HYDROCHLOROTHIAZIDE 25 MG PO TABS
25.0000 mg | ORAL_TABLET | Freq: Every day | ORAL | Status: DC
  Administered 2015-01-19 – 2015-01-22 (×4): 25 mg via ORAL
  Filled 2015-01-19 (×4): qty 1

## 2015-01-19 MED ORDER — DEXTROSE-NACL 5-0.45 % IV SOLN
INTRAVENOUS | Status: DC
  Administered 2015-01-19 – 2015-01-20 (×2): via INTRAVENOUS

## 2015-01-19 MED ORDER — CEFAZOLIN SODIUM-DEXTROSE 2-3 GM-% IV SOLR: 2.0000 g | INTRAVENOUS | Status: DC

## 2015-01-19 MED ORDER — OXYCODONE-ACETAMINOPHEN 5-325 MG PO TABS
1.0000 | ORAL_TABLET | ORAL | Status: DC | PRN
  Administered 2015-01-19 – 2015-01-22 (×8): 2 via ORAL
  Filled 2015-01-19 (×8): qty 2

## 2015-01-19 MED ORDER — KETOROLAC TROMETHAMINE 30 MG/ML IJ SOLN
INTRAMUSCULAR | Status: DC | PRN
  Administered 2015-01-19: 30 mg via INTRAVENOUS

## 2015-01-19 MED ORDER — STERILE WATER FOR IRRIGATION IR SOLN
Status: DC | PRN
  Administered 2015-01-19: 1500 mL

## 2015-01-19 MED ORDER — PNEUMOCOCCAL VAC POLYVALENT 25 MCG/0.5ML IJ INJ
0.5000 mL | INJECTION | INTRAMUSCULAR | Status: AC
  Administered 2015-01-20: 0.5 mL via INTRAMUSCULAR
  Filled 2015-01-19: qty 0.5

## 2015-01-19 MED ORDER — FENTANYL CITRATE (PF) 100 MCG/2ML IJ SOLN
INTRAMUSCULAR | Status: DC | PRN
  Administered 2015-01-19 (×5): 50 ug via INTRAVENOUS
  Administered 2015-01-19: 100 ug via INTRAVENOUS
  Administered 2015-01-19 (×2): 50 ug via INTRAVENOUS

## 2015-01-19 MED ORDER — LACTATED RINGERS IV SOLN
INTRAVENOUS | Status: DC
  Administered 2015-01-19 (×3): via INTRAVENOUS

## 2015-01-19 MED ORDER — ACETAMINOPHEN 10 MG/ML IV SOLN
INTRAVENOUS | Status: AC
  Filled 2015-01-19: qty 100

## 2015-01-19 MED ORDER — MIDAZOLAM HCL 2 MG/2ML IJ SOLN
INTRAMUSCULAR | Status: DC | PRN
  Administered 2015-01-19: 2 mg via INTRAVENOUS

## 2015-01-19 MED ORDER — GLYCOPYRROLATE 0.2 MG/ML IJ SOLN
INTRAMUSCULAR | Status: DC | PRN
Start: 2015-01-19 — End: 2015-01-19
  Administered 2015-01-19: .6 mg via INTRAVENOUS

## 2015-01-19 MED ORDER — ALBUTEROL SULFATE (2.5 MG/3ML) 0.083% IN NEBU
2.5000 mg | INHALATION_SOLUTION | Freq: Once | RESPIRATORY_TRACT | Status: DC
  Filled 2015-01-19: qty 3

## 2015-01-19 MED ORDER — ONDANSETRON HCL 4 MG/2ML IJ SOLN
4.0000 mg | Freq: Four times a day (QID) | INTRAMUSCULAR | Status: DC | PRN
  Administered 2015-01-21: 4 mg via INTRAVENOUS
  Filled 2015-01-19: qty 2

## 2015-01-19 MED ORDER — MINERAL OIL LIGHT 100 % EX OIL
TOPICAL_OIL | CUTANEOUS | Status: AC
Start: 2015-01-19 — End: 2015-01-19
  Filled 2015-01-19: qty 25

## 2015-01-19 MED ORDER — ACETAMINOPHEN 10 MG/ML IV SOLN
INTRAVENOUS | Status: DC | PRN
  Administered 2015-01-19: 1000 mg via INTRAVENOUS

## 2015-01-19 MED ORDER — MORPHINE SULFATE (PF) 2 MG/ML IV SOLN
1.0000 mg | INTRAVENOUS | Status: DC | PRN
  Administered 2015-01-19 – 2015-01-20 (×3): 1 mg via INTRAVENOUS
  Filled 2015-01-19 (×3): qty 1

## 2015-01-19 MED ORDER — BUPIVACAINE HCL (PF) 0.25 % IJ SOLN
INTRAMUSCULAR | Status: DC | PRN
  Administered 2015-01-19: 30 mL

## 2015-01-19 MED ORDER — FENTANYL CITRATE (PF) 100 MCG/2ML IJ SOLN
25.0000 ug | INTRAMUSCULAR | Status: AC | PRN
  Administered 2015-01-19 (×6): 25 ug via INTRAVENOUS

## 2015-01-19 MED ORDER — BISACODYL 5 MG PO TBEC
10.0000 mg | DELAYED_RELEASE_TABLET | Freq: Every day | ORAL | Status: DC
  Administered 2015-01-19 – 2015-01-22 (×4): 10 mg via ORAL
  Filled 2015-01-19 (×4): qty 2

## 2015-01-19 SURGICAL SUPPLY — 63 items
BNDG COHESIVE 4X5 TAN STRL (GAUZE/BANDAGES/DRESSINGS) IMPLANT
BRONCHOSCOPE PED SLIM DISP (MISCELLANEOUS) ×2 IMPLANT
BULB RESERV EVAC DRAIN JP 100C (MISCELLANEOUS) ×2 IMPLANT
CANISTER SUCT 1200ML W/VALVE (MISCELLANEOUS) ×2 IMPLANT
CATH THOR STR 28F  SOFT WA (CATHETERS) ×1
CATH THOR STR 28F SOFT WA (CATHETERS) ×1 IMPLANT
CATH TRAY 16F METER LATEX (MISCELLANEOUS) ×2 IMPLANT
CATH URET ROBINSON 16FR STRL (CATHETERS) IMPLANT
CHLORAPREP W/TINT 26ML (MISCELLANEOUS) ×4 IMPLANT
CNTNR SPEC 2.5X3XGRAD LEK (MISCELLANEOUS) ×1
CONT SPEC 4OZ STER OR WHT (MISCELLANEOUS) ×1
CONTAINER SPEC 2.5X3XGRAD LEK (MISCELLANEOUS) ×1 IMPLANT
CUTTER ECHEON FLEX ENDO 45 340 (ENDOMECHANICALS) ×2 IMPLANT
DRAIN CHANNEL JP 15F RND 16 (MISCELLANEOUS) ×2 IMPLANT
DRAIN CHEST DRY SUCT SGL (MISCELLANEOUS) ×2 IMPLANT
DRAPE C-SECTION (MISCELLANEOUS) ×2 IMPLANT
DRAPE MAG INST 16X20 L/F (DRAPES) ×2 IMPLANT
DRSG OPSITE POSTOP 4X8 (GAUZE/BANDAGES/DRESSINGS) ×2 IMPLANT
DRSG TEGADERM 2-3/8X2-3/4 SM (GAUZE/BANDAGES/DRESSINGS) IMPLANT
DRSG TEGADERM 6X8 (GAUZE/BANDAGES/DRESSINGS) IMPLANT
DRSG TELFA 3X8 NADH (GAUZE/BANDAGES/DRESSINGS) IMPLANT
ELECT BLADE 6.5 EXT (BLADE) ×2 IMPLANT
ELECT CAUTERY BLADE TIP 2.5 (TIP) ×2
ELECTRODE CAUTERY BLDE TIP 2.5 (TIP) ×1 IMPLANT
GAUZE SPONGE 4X4 12PLY STRL (GAUZE/BANDAGES/DRESSINGS) ×2 IMPLANT
GLOVE EXAM LX STRL 7.5 (GLOVE) ×24 IMPLANT
GOWN STRL REUS W/ TWL LRG LVL3 (GOWN DISPOSABLE) ×3 IMPLANT
GOWN STRL REUS W/TWL LRG LVL3 (GOWN DISPOSABLE) ×3
KIT RM TURNOVER STRD PROC AR (KITS) ×2 IMPLANT
LABEL OR SOLS (LABEL) ×2 IMPLANT
LOOP RED MAXI  1X406MM (MISCELLANEOUS) ×1
LOOP VESSEL MAXI 1X406 RED (MISCELLANEOUS) ×1 IMPLANT
MARKER SKIN W/RULER 31145785 (MISCELLANEOUS) ×2 IMPLANT
NEEDLE SPNL 20GX3.5 QUINCKE YW (NEEDLE) ×2 IMPLANT
PACK BASIN MAJOR ARMC (MISCELLANEOUS) ×2 IMPLANT
PAD GROUND ADULT SPLIT (MISCELLANEOUS) ×2 IMPLANT
PENCIL ELECTRO HAND CTR (MISCELLANEOUS) ×4 IMPLANT
RELOAD GOLD ECHELON 45 (STAPLE) ×8 IMPLANT
RELOAD GREEN ECHELON 45 (STAPLE) ×2 IMPLANT
RELOAD STAPLER LINEAR PROX 30 (STAPLE) IMPLANT
SPONGE KITTNER 5P (MISCELLANEOUS) ×2 IMPLANT
STAPLER RELOAD LINEAR PROX 30 (STAPLE)
STAPLER SKIN PROX 35W (STAPLE) ×2 IMPLANT
STAPLER VASCULAR ECHELON 35 (CUTTER) IMPLANT
STRIP CLOSURE SKIN 1/2X4 (GAUZE/BANDAGES/DRESSINGS) ×2 IMPLANT
SUT MNCRL AB 3-0 PS2 27 (SUTURE) ×4 IMPLANT
SUT PROLENE 3 0 SH DA (SUTURE) ×2 IMPLANT
SUT SILK 0 (SUTURE) ×1
SUT SILK 0 30XBRD TIE 6 (SUTURE) ×1 IMPLANT
SUT SILK 1 SH (SUTURE) ×12 IMPLANT
SUT VIC AB 0 CT1 36 (SUTURE) ×4 IMPLANT
SUT VIC AB 2-0 CT1 27 (SUTURE) ×2
SUT VIC AB 2-0 CT1 TAPERPNT 27 (SUTURE) ×2 IMPLANT
SUT VICRYL 2 TP 1 (SUTURE) ×6 IMPLANT
SYR 10ML SLIP (SYRINGE) ×2 IMPLANT
SYR BULB IRRIG 60ML STRL (SYRINGE) ×2 IMPLANT
TAPE ADH 3 LX (MISCELLANEOUS) ×2 IMPLANT
TAPE TRANSPORE STRL 2 31045 (GAUZE/BANDAGES/DRESSINGS) IMPLANT
TROCAR FLEXIPATH 20X80 (ENDOMECHANICALS) IMPLANT
TROCAR FLEXIPATH THORACIC 15MM (ENDOMECHANICALS) IMPLANT
TUBING CONNECTING 10 (TUBING) ×2 IMPLANT
WATER STERILE IRR 1000ML POUR (IV SOLUTION) ×2 IMPLANT
YANKAUER SUCT BULB TIP FLEX NO (MISCELLANEOUS) ×2 IMPLANT

## 2015-01-19 NOTE — Brief Op Note (Signed)
  01/19/2015  1:58 PM  PATIENT:  Vernon Hayes  69 y.o. 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 He recently presented with a right upper lobe mass which on biopsy was a squamous cell carcinoma. He was apprised of the indications and risks of surgery including risks of bleeding infection air leak and death. He was apprised of the alternatives including radiation therapy.  DICTATION: Patient was brought to the operative suite and placed in the supine position. General endotracheal anesthesia was given with a double lumen tube. Preoperative bronchoscopy was carried out. The endotracheal tube was positioned in the left mainstem bronchus. There is no evidence of endobronchial tumor. The patient was then positioned for right thoracotomy. All pressure points were carefully padded. Patient was prepped and draped in usual sterile fashion. A muscle sparing thoracotomy was created by extending an incision from the tip of the scapula forward. The latissimus and serratus muscles were mobilized. The chest was entered. Palpation of the right upper lobe without a tumor mass within the posterior segment. There were no other lesions identified. Using multiple firings of an endoscopic stapler the tumor was removed from the right upper lobe and sent for frozen section diagnosis. Frozen section returned squamous cell carcinoma consistent with metastatic disease. The closest margin was 8 mm once the staples were removed. At this point the suture line was oversewn with a running 3-0 Prolene. A single chest tube was inserted and positioned to the apex  of the right chest. The chest was irrigated and suctioned dry. The wounds were then reapproximated. #2 Vicryl pericostal sutures were used to bring the ribs together. The muscles of the chest wall were allowed to return to their normal anatomic position. The septae is tissues and skin were closed with running absorbable sutures. A 15 [place] JP drain was placed in the subcutaneous tissues. The wounds were then Steri-Stripped and sterile dressings were applied. The patient was turned in the supine position where he was extubated and taken to the recovery room in stable condition.   Nestor Lewandowsky, MD 01/19/2015  1:57 PM  PATIENT:  Vernon Hayes  69 y.o. male  PRE-OPERATIVE DIAGNOSIS:  Right lung cancer  POST-OPERATIVE DIAGNOSIS:  right lung cancer  PROCEDURE:  Procedure(s): Preoperative Bronchoscopy with RIGHT THORACOTOMY and upper lobe resection  (Right)  SURGEON:  Surgeon(s) and Role:    * Nestor Lewandowsky, MD - Primary  ASSISTANTS: Dr. Pat Patrick   ANESTHESIA:   general  EBL:  Total I/O In: 1300 [I.V.:1300] Out: 100 [Urine:50; Blood:50]  BLOOD ADMINISTERED:none  DRAINS: (1) Jackson-Pratt drain(s) with closed bulb suction in the subcutaneous space   LOCAL MEDICATIONS USED:  MARCAINE     SPECIMEN:  Source of Specimen:  right upper lobe wedge  DISPOSITION OF SPECIMEN:  PATHOLOGY  COUNTS:  YES  TOURNIQUET:  * No tourniquets in log *  DICTATION: .Dragon Dictation  PLAN OF CARE: Admit to inpatient   PATIENT DISPOSITION:  PACU - hemodynamically stable.   Delay start of Pharmacological VTE agent (>24hrs) due to surgical blood loss or risk of bleeding: yes

## 2015-01-19 NOTE — H&P (View-Only) (Signed)
[facility] Inpatient Post-Op Note  Patient ID: [facility] BLOODWORTH, male   DOB: 11/01/1945, 70 y.o.   MRN: [id number]  HISTORY: He returns today in follow-up. He did have an opportunity to meet with the cardiologist felt that he was an acceptable risk for surgery. He's also seen our anesthesia colleagues and comes in today to review his options regarding diagnosis and/or management of his right upper lobe mass. He understands that this most likely represents a metastasis from his prior head and neck surgery. We reviewed with him the options at this point. I again discussed with him the possibility of performing a thoracotomy with wedge resection. He understands that the margins are close or positive he may need additional chemotherapy and/or radiation therapy. His only complaint today is discuss some discomfort in the roof of his mouth. I've looked and see no new lesion present.   Filed Vitals:   01/15/15 0926  BP: 156/102  Pulse: 82  Temp: 97 F (36.1 C)     EXAM: Resp: Lungs are clear bilaterally.  No respiratory distress, normal effort. Heart:  Regular without murmurs Abd:  Abdomen is soft, non distended and non tender. No masses are palpable.  There is no rebound and no guarding.  Neurological: Alert and oriented to person, place, and time. Coordination normal.  Skin: Skin is warm and dry. No rash noted. No diaphoretic. No erythema. No pallor.  Psychiatric: Normal mood and affect. Normal behavior. Judgment and thought content normal.    ASSESSMENT: I did review with him again the indications and risks of right thoracotomy and wedge resection. Risks of bleeding infection air leak and death were all reviewed.   PLAN:   He will come in next week for his surgery. He also understands there may be the need for additional postoperative therapy including radiation therapy and/or chemotherapy.    Nestor Lewandowsky, MD

## 2015-01-19 NOTE — Progress Notes (Signed)
I called Dr. Faith Rogue and notified him patient's blood pressure is 175/102 after pain medication. Order received to start on home medication of hydrochlorothiazide and to give first dose now.

## 2015-01-19 NOTE — Interval H&P Note (Signed)
History and Physical Interval Note:  01/19/2015 9:33 AM  Vernon Hayes  has presented today for surgery, with the diagnosis of L  The various methods of treatment have been discussed with the patient and family. After consideration of risks, benefits and other options for treatment, the patient has consented to  Procedure(s): THORACOTOMY MAJOR (Right) as a surgical intervention .  The patient's history has been reviewed, patient examined, no change in status, stable for surgery.  I have reviewed the patient's chart and labs.  Questions were answered to the patient's satisfaction.     Nestor Lewandowsky

## 2015-01-19 NOTE — Transfer of Care (Signed)
Immediate Anesthesia Transfer of Care Note  Patient: Vernon Hayes  Procedure(s) Performed: Procedure(s): Preoperative Bronchoscopy with RIGHT THORACOTOMY and upper lobe resection  (Right)  Patient Location: PACU  Anesthesia Type:General  Level of Consciousness: awake, alert , oriented and patient cooperative  Airway & Oxygen Therapy: Patient Spontanous Breathing and Patient connected to face mask oxygen  Post-op Assessment: Report given to RN and Post -op Vital signs reviewed and stable  Post vital signs: Reviewed  Last Vitals:  Filed Vitals:   01/19/15 1406  BP: 169/140  Pulse: 110  Temp: 36 C  Resp: 13    Complications: No apparent anesthesia complications

## 2015-01-19 NOTE — Anesthesia Preprocedure Evaluation (Addendum)
Anesthesia Evaluation  Patient identified by MRN, date of birth, ID band Patient awake    Reviewed: Allergy & Precautions, NPO status , Patient's Chart, lab work & pertinent test results  Airway Mallampati: II       Dental  (+) Poor Dentition, Loose Loose bottom teeth:   Pulmonary Current Smoker,    Pulmonary exam normal breath sounds clear to auscultation       Cardiovascular hypertension, + Past MI  Normal cardiovascular exam     Neuro/Psych negative neurological ROS     GI/Hepatic Neg liver ROS, GERD  Medicated and Controlled,  Endo/Other  negative endocrine ROS  Renal/GU negative Renal ROS  negative genitourinary   Musculoskeletal negative musculoskeletal ROS (+)   Abdominal Normal abdominal exam  (+)   Peds negative pediatric ROS (+)  Hematology negative hematology ROS (+)   Anesthesia Other Findings   Reproductive/Obstetrics                            Anesthesia Physical Anesthesia Plan  ASA: III  Anesthesia Plan: General   Post-op Pain Management:    Induction: Intravenous  Airway Management Planned: Double Lumen EBT  Additional Equipment: Arterial line  Intra-op Plan:   Post-operative Plan: Extubation in OR  Informed Consent: I have reviewed the patients History and Physical, chart, labs and discussed the procedure including the risks, benefits and alternatives for the proposed anesthesia with the patient or authorized representative who has indicated his/her understanding and acceptance.   Dental advisory given  Plan Discussed with: CRNA and Surgeon  Anesthesia Plan Comments:         Anesthesia Quick Evaluation

## 2015-01-19 NOTE — Anesthesia Procedure Notes (Signed)
Procedure Name: Intubation Performed by: Sinda Du Pre-anesthesia Checklist: Patient identified, Emergency Drugs available, Suction available, Patient being monitored and Timeout performed Patient Re-evaluated:Patient Re-evaluated prior to inductionOxygen Delivery Method: Circle system utilized Preoxygenation: Pre-oxygenation with 100% oxygen Intubation Type: IV induction Ventilation: Mask ventilation without difficulty Laryngoscope Size: Glidescope and 4 Grade View: Grade III Endobronchial tube: Left and Double lumen EBT and 39 Fr Number of attempts: 3 Airway Equipment and Method: Stylet Placement Confirmation: ETT inserted through vocal cords under direct vision,  positive ETCO2 and breath sounds checked- equal and bilateral Secured at: 21 cm Tube secured with: Tape Dental Injury: Teeth and Oropharynx as per pre-operative assessment  Difficulty Due To: Difficult Airway- due to anterior larynx

## 2015-01-19 NOTE — Progress Notes (Signed)
[place] Progress Note Patient Name: Vernon Hayes DOB: 07-Oct-1945 MRN: [id number]   Date of Service  01/19/2015  HPI/Events of Note  Pt admitted from OR/pacu with s/p RUL resection of lung CA.  Pt stable on [place] postop with mild HTN  eICU Interventions  To restart home HTN meds Cont pulm toilet and ICU monitoring      Intervention Category Evaluation Type: New Patient Evaluation  Asencion Noble 01/19/2015, 7:24 PM

## 2015-01-20 DIAGNOSIS — C3411 Malignant neoplasm of upper lobe, right bronchus or lung: Secondary | ICD-10-CM | POA: Diagnosis not present

## 2015-01-20 LAB — COMPREHENSIVE METABOLIC PANEL
ALK PHOS: 54 U/L (ref 38–126)
ALT: 12 U/L — AB (ref 17–63)
AST: 38 U/L (ref 15–41)
Albumin: 3.6 g/dL (ref 3.5–5.0)
Anion gap: 8 (ref 5–15)
BILIRUBIN TOTAL: 1 mg/dL (ref 0.3–1.2)
BUN: 19 mg/dL (ref 6–20)
CALCIUM: 8.5 mg/dL — AB (ref 8.9–10.3)
CHLORIDE: 100 mmol/L — AB (ref 101–111)
CO2: 30 mmol/L (ref 22–32)
CREATININE: 1.29 mg/dL — AB (ref 0.61–1.24)
GFR, EST NON AFRICAN AMERICAN: 55 mL/min — AB (ref >60)
Glucose, Bld: 124 mg/dL — ABNORMAL HIGH (ref 65–99)
Potassium: 3.2 mmol/L — ABNORMAL LOW (ref 3.5–5.1)
Sodium: 138 mmol/L (ref 135–145)
TOTAL PROTEIN: 6.8 g/dL (ref 6.5–8.1)

## 2015-01-20 LAB — CBC
HEMATOCRIT: 39.8 % — AB (ref 40.0–52.0)
Hemoglobin: 13.5 g/dL (ref 13.0–18.0)
MCH: 34.8 pg — AB (ref 26.0–34.0)
MCHC: 33.9 g/dL (ref 32.0–36.0)
MCV: 102.8 fL — AB (ref 80.0–100.0)
PLATELETS: 136 10*3/uL — AB (ref 150–440)
RBC: 3.88 MIL/uL — AB (ref 4.40–5.90)
RDW: 17.4 % — ABNORMAL HIGH (ref 11.5–14.5)
WBC: 7.5 10*3/uL (ref 3.8–10.6)

## 2015-01-20 LAB — SURGICAL PATHOLOGY

## 2015-01-20 MED ORDER — ENSURE ENLIVE PO LIQD
237.0000 mL | Freq: Two times a day (BID) | ORAL | Status: DC
  Administered 2015-01-20 – 2015-01-22 (×3): 237 mL via ORAL

## 2015-01-20 MED ORDER — POTASSIUM CHLORIDE CRYS ER 20 MEQ PO TBCR
20.0000 meq | EXTENDED_RELEASE_TABLET | Freq: Two times a day (BID) | ORAL | Status: DC
  Administered 2015-01-20 – 2015-01-22 (×5): 20 meq via ORAL
  Filled 2015-01-20 (×5): qty 1

## 2015-01-20 NOTE — Progress Notes (Signed)
Spoke to Dr. Genevive Bi- made him aware that potassium is 3.2- patient's blood pressure is high in 150's 160's and patient would like diet increased from clears.  MD stated he would place orders to replace potassium, change diet and transfer patient to floor.

## 2015-01-20 NOTE — Progress Notes (Signed)
Arrived to unit from CCU. Pleasant upon arrival. Suddenly confused and agitated. Pulling on chest tube and other drains. Getting up out of bed and pushing staff out of way. Dr. Genevive Bi notified of behavior and BP of 165/94. Sitter at bedside ordered and continue to monitor BP.

## 2015-01-20 NOTE — Progress Notes (Signed)
Read results of surgical pathology result to Dr. Genevive Bi.

## 2015-01-20 NOTE — Progress Notes (Signed)
Vernon Hayes Inpatient Post-Op Note  Patient ID: Vernon Hayes, male   DOB: [birth year], 69 y.o.   MRN: [id number]  HISTORY: Some pain.  No fever.  Hungry.   Filed Vitals:   01/20/15 0800  BP: 186/98  Pulse: 51  Temp: 98.1 F (36.7 C)  Resp: 14     EXAM: Resp: Lungs are clear bilaterally.  No respiratory distress, normal effort. Heart:  Regular without murmurs  No air leak.      ASSESSMENT:  Doing well.  No air leak.  Hungry.  Will advance diet  PLAN:   Will transfer to floor.  Continue chest tube and JP drain.  Will advance diet and get physical therapy to see patient.     Nestor Lewandowsky, MD

## 2015-01-20 NOTE — Progress Notes (Signed)
Initial Nutrition Assessment  DOCUMENTATION CODES:   Non-severe (moderate) malnutrition in context of chronic illness  INTERVENTION:   Meals and Snacks: Cater to patient preferences Medical Food Supplement Therapy: recommend addition of Ensure Enlive po BID, each supplement provides 350 kcal and 20 grams of protein  NUTRITION DIAGNOSIS:   Malnutrition related to cancer and cancer related treatments as evidenced by mild depletion of body fat, mild depletion of muscle mass.  GOAL:   Patient will meet greater than or equal to 90% of their needs  MONITOR:    (Energy Intake, Anthropometrics, Electrolyte/Renal Profile, Digestive System, Glucose profile)  REASON FOR ASSESSMENT:   Malnutrition Screening Tool    ASSESSMENT:    Pt admitted with RUL lung mass likely metastatic from head and neck cancer; pt s/p RUL wedge resection yesterday, chest tube in place  Past Medical History  Diagnosis Date  . GERD (gastroesophageal reflux disease)   . Heart attack 1982  . Hypertension   . Cancer head and neck    Diet Order:  Diet regular Room service appropriate?: Yes; Fluid consistency:: Thin   Energy Intake: ate jello this AM for breakfast  Food and Nutrition Related History: pt reports appetite has been fair at home; reports he ate meatloaf and mashed potatoes for dinner on Monday night, pt does not elaborate much more. Used to drink Ensure but does not anymore. Difficulty to get information from pt  Meds: D5-1/2 NS at 100 ml/hr  (408 kcals)  Digestive System: pt with poor dentition, reports that once he gets "this" taken care of he is going to take care of his teeth. Pt initially reports difficulty time chewing but then reports he can chew tough meats, salads, etc  Skin:  Reviewed, no issues  Last BM:  9/13   Electrolyte and Renal Profile:  Recent Labs Lab 01/20/15 0613  BUN 19  CREATININE 1.29*  NA 138  K 3.2*   Glucose Profile:   Recent Labs  01/19/15 1524   GLUCAP 104*   Protein Profile:   Recent Labs Lab 01/20/15 0613  ALBUMIN 3.6   Nutrition Focused Physical Exam: Nutrition-Focused physical exam completed. Findings are mild fat depletion, mild muscle depletion, and no edema.    Height:   Ht Readings from Last 1 Encounters:  01/19/15 6' (1.829 m)    Weight: pt reports 7 pound wt loss in 3 weeks; 40.4% wt loss. Pt reports he weighed 210 pounds 3 years ago when he was diagnosed with cancer. Pt estimates that he weighed 170 pounds 1 year ago; 10% wt loss  Wt Readings from Last 1 Encounters:  01/19/15 153 lb (69.4 kg)   Wt Readings from Last 10 Encounters:  01/19/15 153 lb (69.4 kg)  01/15/15 153 lb 3.2 oz (69.491 kg)  01/14/15 155 lb (70.308 kg)  01/12/15 155 lb 8 oz (70.534 kg)  01/08/15 162 lb 6.4 oz (73.664 kg)  12/17/14 157 lb (71.215 kg)  12/09/14 161 lb 6 oz (73.199 kg)    BMI:  Body mass index is 20.75 kg/(m^2).  Estimated Nutritional Needs:   Kcal:  2126-2513 kcals (BEE 1487, 1.3 AF, 1.1-1.3 IF)   Protein:  76-97 g (1.1-1.4 g/kg)   Fluid:  2070-2415 mL (30-35 ml/kg)   MODERATE Care Level  Kerman Passey MS, RD, LDN [phone] Pager

## 2015-01-20 NOTE — Progress Notes (Signed)
   01/20/15 1400  Clinical Encounter Type  Visited With Patient  Visit Type Initial  Referral From Nurse  Spiritual Encounters  Spiritual Needs Prayer  Stress Factors  Patient Stress Factors Health changes  Chaplain Thoms  Engaged patient and offered prayer and support. Chaplain Marcello Moores

## 2015-01-20 NOTE — Anesthesia Postprocedure Evaluation (Signed)
  Anesthesia Post-op Note  Patient: Vernon Hayes  Procedure(s) Performed: Procedure(s): Preoperative Bronchoscopy with RIGHT THORACOTOMY and upper lobe resection  (Right)  Anesthesia type:General  Patient location: ICU 16  Post pain: Pain level controlled  Post assessment: Post-op Vital signs reviewed, Patient's Cardiovascular Status Stable, Respiratory Function Stable, Patent Airway and No signs of Nausea or vomiting  Post vital signs: Reviewed and stable  Last Vitals:  Filed Vitals:   01/20/15 0800  BP: 186/98  Pulse: 51  Temp: 36.7 C  Resp: 14    Level of consciousness: awake, alert  and patient cooperative  Complications: No apparent anesthesia complications

## 2015-01-20 NOTE — Progress Notes (Signed)
PT Cancellation Note  Patient Details Name: Vernon Hayes MRN: [id number] DOB: May 28, 1945   Cancelled Treatment:    Reason Eval/Treat Not Completed: Patient declined, no reason specified (Consult received and chart reviewed. Per primary RN, patient has been up to chair today and just returned to bed with nursing.  Attempted evaluation, but patient refused at this time due to pain, stating he has just gotten back to bed and wasn't getting up again.  Will re-attempt next date as medically appropriate and patient allows.)   Graycie Halley H. Owens Shark, PT, DPT, NCS 01/20/2015, 3:39 PM [phone]

## 2015-01-21 ENCOUNTER — Inpatient Hospital Stay: Payer: Medicare Other

## 2015-01-21 LAB — TYPE AND SCREEN
ABO/RH(D): O POS
ANTIBODY SCREEN: NEGATIVE
UNIT DIVISION: 0
Unit division: 0

## 2015-01-21 MED ORDER — ALBUTEROL SULFATE (2.5 MG/3ML) 0.083% IN NEBU: 2.5000 mg | INHALATION_SOLUTION | RESPIRATORY_TRACT | Status: DC | PRN

## 2015-01-21 MED ORDER — BISACODYL 10 MG RE SUPP
10.0000 mg | Freq: Once | RECTAL | Status: AC
  Administered 2015-01-21: 10 mg via RECTAL
  Filled 2015-01-21: qty 1

## 2015-01-21 MED ORDER — PANTOPRAZOLE SODIUM 40 MG IV SOLR
40.0000 mg | Freq: Two times a day (BID) | INTRAVENOUS | Status: DC
  Administered 2015-01-21 – 2015-01-22 (×4): 40 mg via INTRAVENOUS
  Filled 2015-01-21 (×4): qty 40

## 2015-01-21 MED ORDER — ENALAPRILAT 1.25 MG/ML IV SOLN
0.6250 mg | Freq: Four times a day (QID) | INTRAVENOUS | Status: DC | PRN
  Administered 2015-01-21: 0.625 mg via INTRAVENOUS
  Filled 2015-01-21 (×2): qty 0.5

## 2015-01-21 NOTE — Care Management Important Message (Signed)
Important Message  Patient Details  Name: Vernon Hayes MRN: [id number] Date of Birth: 1945/09/28   Medicare Important Message Given:  Yes-second notification given    [name] 01/21/2015, 9:57 AM

## 2015-01-21 NOTE — Progress Notes (Signed)
md notified of bp. Orders received.

## 2015-01-21 NOTE — Care Management (Addendum)
Patient is from home alone. Patient is alert and oriented and answers questions appropriately. Patient has no family in the area and relies on "friends" for transportation to and from appointments. Patient receives services from [employer]. Stated that PCP is Dr Lavera Guise. Patient stated that he does not have or use any DME at home. States that he has no difficulty with medications. Anticipate possible discharge with [facility] RN. Patient does not want to make a decision on Home Health providers at this time . Would like for the writer to return tomorrow for decision. Patient wants to contact friend whom he thinks works for [facility].

## 2015-01-21 NOTE — Progress Notes (Signed)
md notified of pt vomiting emesis dark pink in color. PPI ordered. Will continue to monitor.

## 2015-01-21 NOTE — Evaluation (Signed)
Physical Therapy Evaluation Patient Details Name: Vernon Hayes MRN: [id number] DOB: 1945/06/23 Today's Date: 01/21/2015   History of Present Illness  admitted for acute hospitailization status post R lung thoracotomy with wedge resection (9/13); R chest tube (to water seal) in place.  Clinical Impression  Upon evaluation, patient alert and oriented; follows all commands, though requires increased encouragement for participation with session.  Bilat UE/LE strength and ROM grossly WFL, except R LE elevation slightly limited by pain in R chest.  Able to complete bed mobility with mod indep; sit/stand, basic transfers and gait (75'x1, 25' x1) without assist device, cga/close sup.  Fair/good stability noted without buckling or LOB.  Maintains sats >93% on RA at rest and with exertion; refused further distance/activity due to pain.  Did encourage patient for additional gait trials with RN this PM, with goal of around RN station.  Patient begrudgingly voiced understanding.    Follow Up Recommendations  (will see throughout hospitalization; anticipate no PT needs at discharge.  May benefit from outpatient pulmonary rehab upon discharge)    Equipment Recommendations       Recommendations for Other Services       Precautions / Restrictions Precautions Precautions: Fall Restrictions Weight Bearing Restrictions: No      Mobility  Bed Mobility Overal bed mobility: Modified Independent             General bed mobility comments: transition towards R, supervision for management of chest tube  Transfers Overall transfer level: Needs assistance Equipment used: None Transfers: Sit to/from Stand Sit to Stand: Supervision            Ambulation/Gait Ambulation/Gait assistance: Min guard;Supervision Ambulation Distance (Feet): 75 Feet Assistive device: None       General Gait Details: reciprocal stepping with fair step height/length, fair cadence and gait speed.  Trunk rotation  slightly guarded due to pain in R chest.  Maintains sats >93% with gait on RA; refuses further distance due to pain  Stairs            Wheelchair Mobility    Modified Rankin (Stroke Patients Only)       Balance Overall balance assessment: Needs assistance Sitting-balance support: No upper extremity supported;Feet supported       Standing balance support: No upper extremity supported Standing balance-Leahy Scale: Fair                               Pertinent Vitals/Pain Pain Assessment: Faces Faces Pain Scale: Hurts even more Pain Location: R cehst Pain Descriptors / Indicators: Aching;Sore Pain Intervention(s): Limited activity within patient's tolerance;Monitored during session;Repositioned    Home Living Family/patient expects to be discharged to:: Private residence Living Arrangements: Alone   Type of Home: House Home Access: Stairs to enter Entrance Stairs-Rails: None Entrance Stairs-Number of Steps: 1 Home Layout: One level        Prior Function Level of Independence: Independent               Hand Dominance        Extremity/Trunk Assessment   Upper Extremity Assessment: Overall WFL for tasks assessed (R UE elevation limited by pain in R chest)           Lower Extremity Assessment: Overall WFL for tasks assessed         Communication   Communication: No difficulties  Cognition Arousal/Alertness: Awake/alert Behavior During Therapy: WFL for tasks assessed/performed (generally agitated, but cooperative  with session) Overall Cognitive Status: Within Functional Limits for tasks assessed                      General Comments General comments (skin integrity, edema, etc.): R chest tube, JP drain    Exercises Other Exercises Other Exercises: Toilet transfer, ambulatory without assist device, cga/close sup; sit/stand from standard toilet with grab bar, cga/close sup.  Able to manage stationary obstacles, surfaces  changes without difficulty.  (8 min)      Assessment/Plan    PT Assessment Patient needs continued PT services  PT Diagnosis Difficulty walking;Generalized weakness   PT Problem List Decreased strength;Decreased activity tolerance;Decreased balance;Decreased mobility;Cardiopulmonary status limiting activity;Pain  PT Treatment Interventions Gait training;Stair training;Functional mobility training;Therapeutic activities;Therapeutic exercise;Patient/family education;Balance training   PT Goals (Current goals can be found in the Care Plan section) Acute Rehab PT Goals Patient Stated Goal: "to get back to bed" PT Goal Formulation: With patient Time For Goal Achievement: 02/04/15 Potential to Achieve Goals: Good    Frequency Min 2X/week   Barriers to discharge        Co-evaluation               End of Session   Activity Tolerance: Patient tolerated treatment well Patient left: in bed;with call bell/phone within reach;with bed alarm set;with nursing/sitter in room           Time: 1135-1159 PT Time Calculation (min) (ACUTE ONLY): 24 min   Charges:   PT Evaluation $Initial PT Evaluation Tier I: 1 Procedure PT Treatments $Therapeutic Activity: 8-22 mins   PT G Codes:        Robet Crutchfield H. Owens Shark, PT, DPT, NCS 01/21/2015, 2:57 PM [phone]

## 2015-01-21 NOTE — Progress Notes (Signed)
md notified again of pt emesis amount and color (rust colored). Will continue to monitor

## 2015-01-22 ENCOUNTER — Inpatient Hospital Stay: Payer: Medicare Other

## 2015-01-22 ENCOUNTER — Other Ambulatory Visit: Payer: Self-pay | Admitting: *Deleted

## 2015-01-22 DIAGNOSIS — C3491 Malignant neoplasm of unspecified part of right bronchus or lung: Secondary | ICD-10-CM

## 2015-01-22 DIAGNOSIS — Z9689 Presence of other specified functional implants: Secondary | ICD-10-CM

## 2015-01-22 DIAGNOSIS — E440 Moderate protein-calorie malnutrition: Secondary | ICD-10-CM | POA: Insufficient documentation

## 2015-01-22 LAB — CBC
HEMATOCRIT: 39.7 % — AB (ref 40.0–52.0)
Hemoglobin: 13.2 g/dL (ref 13.0–18.0)
MCH: 34.3 pg — ABNORMAL HIGH (ref 26.0–34.0)
MCHC: 33.3 g/dL (ref 32.0–36.0)
MCV: 103.1 fL — ABNORMAL HIGH (ref 80.0–100.0)
PLATELETS: 133 10*3/uL — AB (ref 150–440)
RBC: 3.85 MIL/uL — ABNORMAL LOW (ref 4.40–5.90)
RDW: 17.4 % — AB (ref 11.5–14.5)
WBC: 6.3 10*3/uL (ref 3.8–10.6)

## 2015-01-22 LAB — BASIC METABOLIC PANEL
ANION GAP: 8 (ref 5–15)
BUN: 16 mg/dL (ref 6–20)
CALCIUM: 9.2 mg/dL (ref 8.9–10.3)
CO2: 30 mmol/L (ref 22–32)
CREATININE: 1.26 mg/dL — AB (ref 0.61–1.24)
Chloride: 99 mmol/L — ABNORMAL LOW (ref 101–111)
GFR, EST NON AFRICAN AMERICAN: 56 mL/min — AB (ref >60)
Glucose, Bld: 93 mg/dL (ref 65–99)
Potassium: 3.3 mmol/L — ABNORMAL LOW (ref 3.5–5.1)
SODIUM: 137 mmol/L (ref 135–145)

## 2015-01-22 MED ORDER — POTASSIUM CHLORIDE CRYS ER 20 MEQ PO TBCR: 20.0000 meq | EXTENDED_RELEASE_TABLET | Freq: Two times a day (BID) | ORAL | Status: DC

## 2015-01-22 MED ORDER — BISACODYL 5 MG PO TBEC: 10.0000 mg | DELAYED_RELEASE_TABLET | Freq: Every day | ORAL | Status: DC

## 2015-01-22 MED ORDER — SODIUM CHLORIDE 0.9 % IV SOLN: Freq: Once | INTRAVENOUS | Status: DC

## 2015-01-22 MED ORDER — HEPARIN SOD (PORK) LOCK FLUSH 100 UNIT/ML IV SOLN
500.0000 [IU] | Freq: Once | INTRAVENOUS | Status: AC
  Administered 2015-01-22: 500 [IU] via INTRAVENOUS
  Filled 2015-01-22: qty 5

## 2015-01-22 NOTE — Progress Notes (Signed)
[facility] Post-Op Note  Patient ID: Vernon Hayes, male   DOB: February 03, 1946, 69 y.o.   MRN: [id number]  HISTORY: He did very well overnight. He's passing gas and had a bowel movement. His abdomen is much less distended. His appetites reasonable and he has been able to eat and keep down all his food. His pain is manageable except for deep coughs. His Jackson-Pratt drain 30 cc yesterday. There is no air leak from the chest tube today and its draining only minimal amounts of serosanguineous material.   Filed Vitals:   01/22/15 0420  BP: 149/98  Pulse: 76  Temp: 97.5 F (36.4 C)  Resp: 16     EXAM: Resp: Lungs are clear bilaterally.  No respiratory distress, normal effort. Heart:  Regular without murmurs Abd:  Abdomen is soft, non distended and non tender. No masses are palpable.  There is no rebound and no guarding.  Neurological: Alert and oriented to person, place, and time. Coordination normal.  Skin: Skin is warm and dry. No rash noted. No diaphoretic. No erythema. No pallor. Wounds are clean and dry Psychiatric: Normal mood and affect. Normal behavior. Judgment and thought content normal.   No air leak seen with cough    ASSESSMENT: Will clamp chest tube and obtain chest xray later today.  Reviewed discharge planning with Care Management and Oncology.     PLAN:   Plan is to send patient home with JP in place and have visiting nurse come daily.  Will see Dr. Ma Hillock in the Oncology Clinic on Monday 9.19.16 for possible drain removal.  Will see me in 2 weeks.     Nestor Lewandowsky, MD

## 2015-01-22 NOTE — Care Management (Signed)
Account representative Vernon Hayes came to see patient for Vernon Hayes. Discussed case. RN initial visit will be tomorrow.

## 2015-01-22 NOTE — Op Note (Signed)
  01/19/2015  12:37 PM  PATIENT:  Vernon Hayes  69 y.o. 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 Bronson Ing  ANESTHESIA: Gen. 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 He's had an enlarging right upper lobe mass which on biopsy was consistent with squamous cell carcinoma and a potential metastasis. There were no other sites of disease and the patient was therefore offered a right thoracotomy with wedge resection. Indications and risks of the procedure were explained the patient gave his informed consent.  DICTATION: Patient brought to the operating suite and placed in the supine position. General endotracheal anesthesia was given through a double-lumen tube. Preoperative bronchoscopy was carried out. There is no evidence of endobronchial tumor. The patient was then turned for right thoracotomy. All pressure points were carefully padded. Patient was prepped and draped in usual sterile fashion. A muscle-sparing thoracotomy was created. The latissimus and serratus muscles were attracted. The chest was entered. Palpation of the lung revealed a single solitary mass present in the posterior aspect of the right upper lobe. There were some smaller cystic areas in the lung which were consistent with his emphysema. There was no evidence of intrapleural disease. The lung above the mass was grasped with a lung clamp and using multiple firings of an endoscopic stapler the lesion was excised. This was sent for frozen section with margins. Frozen  section confirmed the presence of a squamous cell carcinoma consistent with metastatic disease. Margins were reportedly at 8 mm. We did not see any obvious lymphadenopathy in the mediastinum and therefore closed. A single 28 French chest tube was positioned anteriorly to the apex of the chest. The ribs were reapproximated with #2 Vicryl pericostal sutures. The muscles of the chest wall were allowed returned to their normal anatomic position. The subcutaneous tissues were drained with a Blake drain and the wounds were closed with running absorbable sutures. Patient tolerated procedure well was x-rayed and taken to the recovery room in stable condition.   Nestor Lewandowsky, MD

## 2015-01-22 NOTE — Discharge Summary (Signed)
Physician Discharge Summary  Patient ID: Vernon Hayes MRN: [id number] DOB/AGE: Nov 28, 1945 69 y.o.  Admit date: 01/19/2015 Discharge date: 01/22/2015   Discharge Diagnoses:  Active Problems:   Lung mass   Malnutrition of moderate degree   Procedures:right thoracotomy and wedge resection of RUL mass  Hospital Course: No real problems.  Chest tube clamped for 4 hours prior to discharge and chest xray was without pneumothorax.  Discussed with Dr. Ma Hillock who will see as outpatient early next week.  To remove JP drain if less than 30 cc per day.  `  Disposition: Home with home health nurse  Discharge Instructions    Diet - low sodium heart healthy    Complete by:  As directed      Discharge wound care:    Complete by:  As directed   Leave dressing as is.  Do not remove.  Will followup with Dr. Ma Hillock on Monday.  Dr. Ma Hillock will change the dressing.  Record the drainage from the [facility] drain every 8 hours and record the drainage on a sheet of paper and bring with you to the clinic on Monday.     Increase activity slowly    Complete by:  As directed             Medication List    STOP taking these medications        albuterol (2.5 MG/3ML) 0.083% nebulizer solution  Commonly known as:  PROVENTIL     amoxicillin-clavulanate 875-125 MG per tablet  Commonly known as:  AUGMENTIN     aspirin 325 MG tablet      TAKE these medications        bisacodyl 5 MG EC tablet  Commonly known as:  DULCOLAX  Take 2 tablets (10 mg total) by mouth daily.     gabapentin 300 MG capsule  Commonly known as:  NEURONTIN  Take 300 mg by mouth at bedtime.     hydrochlorothiazide 25 MG tablet  Commonly known as:  HYDRODIURIL  Take 25 mg by mouth every morning.     meclizine 25 MG tablet  Commonly known as:  ANTIVERT  Take 1 tablet (25 mg total) by mouth 3 (three) times daily as needed for dizziness.     permethrin 5 % cream  Commonly known as:  ELIMITE  APPLY FROM NECK TO FEET AT  NIGHT AND THEN [place] OFF IN THE MORNING. REPEAT AFTER 1 WEEK.     potassium chloride SA 20 MEQ tablet  Commonly known as:  K-DUR,KLOR-CON  Take 1 tablet (20 mEq total) by mouth 2 (two) times daily.           Follow-up Information    Follow up with Leia Alf, MD In 3 days.   Specialty:  Internal Medicine   Contact information:   [address] [place] [address] [phone]       Nestor Lewandowsky, MD

## 2015-01-22 NOTE — Care Management (Signed)
Spoke with Dr Genevive Bi concerning patient discharge.  Patient may possibly be ready for discharge today. MD would like Home Health over the weekend, Saturday and Sunday.   Spoke with Sharmon Revere at [facility] who would be able to provide  RN services both Saturday and Sunday. Discussed plan with patient who is agreeable to plan.  Patient  nurse Raquel Sarna will continue with JP drain teaching prior to discharge. Referral sent to [facility] who will be by to see patient today prior to discharge.

## 2015-01-22 NOTE — Progress Notes (Signed)
A&O. Grumpy at times. Medicated for pain with relief noted. No nausea present. Chest tube removed by Dr. Genevive Bi. Dressing dry and intact. JP drain intact and pt instructed how to care for drain. Pt states he understanding how to empty and care for drain. IV removed per policy. Discharged per MD orders. Discharge instructions reviewed with pt and pt verbalized understanding. Discharged via wheelchair escorted bu auxilary.

## 2015-01-22 NOTE — Care Management (Deleted)
Spoke with PA for Dr Lucky Cowboy,  Herbert Moors. Follow up appointments placed in discharge instructions for Vascular. Also instructions given for frequency of  Dressing changes. 3 times weekly to begin on this Saturday 17th, Sunday at the latest x 3 months. Still awaiting wound vac delivery Nurse informed . Floydene Flock at [facility] informed of instructions.

## 2015-01-22 NOTE — Care Management (Signed)
Notified Sharmon Revere at [facility] of patient discharge today. She stated that she will be by to see patient this afternoon. Refferal Faxed.

## 2015-01-25 ENCOUNTER — Inpatient Hospital Stay (HOSPITAL_BASED_OUTPATIENT_CLINIC_OR_DEPARTMENT_OTHER): Payer: Medicare Other | Admitting: Internal Medicine

## 2015-01-25 VITALS — BP 146/91 | HR 83 | Temp 95.4°F | Resp 18 | Ht 72.0 in | Wt 152.6 lb

## 2015-01-25 DIAGNOSIS — R070 Pain in throat: Secondary | ICD-10-CM

## 2015-01-25 DIAGNOSIS — Z85810 Personal history of malignant neoplasm of tongue: Secondary | ICD-10-CM

## 2015-01-25 DIAGNOSIS — Z7982 Long term (current) use of aspirin: Secondary | ICD-10-CM

## 2015-01-25 DIAGNOSIS — Z79899 Other long term (current) drug therapy: Secondary | ICD-10-CM

## 2015-01-25 DIAGNOSIS — R42 Dizziness and giddiness: Secondary | ICD-10-CM

## 2015-01-25 DIAGNOSIS — I252 Old myocardial infarction: Secondary | ICD-10-CM

## 2015-01-25 DIAGNOSIS — K088 Other specified disorders of teeth and supporting structures: Secondary | ICD-10-CM | POA: Diagnosis not present

## 2015-01-25 DIAGNOSIS — R630 Anorexia: Secondary | ICD-10-CM | POA: Diagnosis not present

## 2015-01-25 DIAGNOSIS — K219 Gastro-esophageal reflux disease without esophagitis: Secondary | ICD-10-CM

## 2015-01-25 DIAGNOSIS — F17210 Nicotine dependence, cigarettes, uncomplicated: Secondary | ICD-10-CM | POA: Diagnosis not present

## 2015-01-25 DIAGNOSIS — I10 Essential (primary) hypertension: Secondary | ICD-10-CM

## 2015-01-25 DIAGNOSIS — C01 Malignant neoplasm of base of tongue: Secondary | ICD-10-CM

## 2015-01-25 DIAGNOSIS — R942 Abnormal results of pulmonary function studies: Secondary | ICD-10-CM

## 2015-01-25 DIAGNOSIS — C3411 Malignant neoplasm of upper lobe, right bronchus or lung: Secondary | ICD-10-CM | POA: Diagnosis present

## 2015-01-25 MED ORDER — MEGESTROL ACETATE 400 MG/10ML PO SUSP: 400.0000 mg | Freq: Every day | ORAL | Status: DC

## 2015-01-25 NOTE — Progress Notes (Signed)
Patient is here for follow-up post surgery. He states that he is still sore and it hurts when he coughs. He states that his appetite has not been that great.

## 2015-01-26 ENCOUNTER — Telehealth: Payer: Self-pay | Admitting: *Deleted

## 2015-01-26 NOTE — Telephone Encounter (Signed)
Asking for a verbal order to do PT twice a week for 4 weeks for endurance and safety. Askin gif there are any precautions or restrictions

## 2015-01-26 NOTE — Telephone Encounter (Signed)
Called and got voicemail for Azelyn at [facility].  Dr. Ma Hillock and Dr Genevive Bi is not here today and I can ask tom. But Dr. Genevive Bi did pt's surgery and ordered home health so it may be him that I need to ask but I will check and let her know tom.

## 2015-01-27 NOTE — Telephone Encounter (Signed)
Called back to Azelyn and she took verbal order and will send a fax to sign to get services.  It is for PT in the home.

## 2015-01-27 NOTE — Telephone Encounter (Signed)
Judeen Hammans discussed this with me today and is sending request for home PT. Thanks.

## 2015-02-01 ENCOUNTER — Ambulatory Visit
Admission: RE | Admit: 2015-02-01 | Discharge: 2015-02-01 | Disposition: A | Discharge status: Home / Self Care | Payer: Medicare Other | Source: Ambulatory Visit | Attending: Cardiothoracic Surgery | Admitting: Cardiothoracic Surgery

## 2015-02-01 ENCOUNTER — Inpatient Hospital Stay (HOSPITAL_BASED_OUTPATIENT_CLINIC_OR_DEPARTMENT_OTHER): Payer: Medicare Other | Admitting: Cardiothoracic Surgery

## 2015-02-01 VITALS — BP 151/88 | HR 57 | Temp 95.9°F | Resp 18 | Ht 72.0 in | Wt 156.4 lb

## 2015-02-01 DIAGNOSIS — F17200 Nicotine dependence, unspecified, uncomplicated: Secondary | ICD-10-CM | POA: Insufficient documentation

## 2015-02-01 DIAGNOSIS — I10 Essential (primary) hypertension: Secondary | ICD-10-CM | POA: Diagnosis not present

## 2015-02-01 DIAGNOSIS — C3491 Malignant neoplasm of unspecified part of right bronchus or lung: Secondary | ICD-10-CM

## 2015-02-01 DIAGNOSIS — C801 Malignant (primary) neoplasm, unspecified: Secondary | ICD-10-CM

## 2015-02-01 DIAGNOSIS — M5134 Other intervertebral disc degeneration, thoracic region: Secondary | ICD-10-CM | POA: Insufficient documentation

## 2015-02-01 DIAGNOSIS — J449 Chronic obstructive pulmonary disease, unspecified: Secondary | ICD-10-CM | POA: Diagnosis not present

## 2015-02-01 DIAGNOSIS — Z9689 Presence of other specified functional implants: Secondary | ICD-10-CM

## 2015-02-01 NOTE — Progress Notes (Signed)
No changes since last visit.  Pt continues to have a cough with Sputum beige in color not thick not thin

## 2015-02-01 NOTE — Progress Notes (Signed)
Removed one remaining suture applied steri strips,

## 2015-02-11 ENCOUNTER — Encounter: Payer: Self-pay | Admitting: Medical Oncology

## 2015-02-11 ENCOUNTER — Telehealth: Payer: Self-pay | Admitting: Internal Medicine

## 2015-02-11 ENCOUNTER — Emergency Department: Payer: Medicare Other

## 2015-02-11 ENCOUNTER — Emergency Department
Admission: EM | Admit: 2015-02-11 | Discharge: 2015-02-11 | Disposition: A | Discharge status: Home / Self Care | Payer: Medicare Other | Attending: Emergency Medicine | Admitting: Emergency Medicine

## 2015-02-11 DIAGNOSIS — Y9289 Other specified places as the place of occurrence of the external cause: Secondary | ICD-10-CM | POA: Diagnosis not present

## 2015-02-11 DIAGNOSIS — F10120 Alcohol abuse with intoxication, uncomplicated: Secondary | ICD-10-CM | POA: Diagnosis not present

## 2015-02-11 DIAGNOSIS — W19XXXA Unspecified fall, initial encounter: Secondary | ICD-10-CM

## 2015-02-11 DIAGNOSIS — Y998 Other external cause status: Secondary | ICD-10-CM | POA: Diagnosis not present

## 2015-02-11 DIAGNOSIS — Y9389 Activity, other specified: Secondary | ICD-10-CM | POA: Insufficient documentation

## 2015-02-11 DIAGNOSIS — S29001A Unspecified injury of muscle and tendon of front wall of thorax, initial encounter: Secondary | ICD-10-CM | POA: Diagnosis not present

## 2015-02-11 DIAGNOSIS — S0990XA Unspecified injury of head, initial encounter: Secondary | ICD-10-CM | POA: Diagnosis present

## 2015-02-11 DIAGNOSIS — Z720 Tobacco use: Secondary | ICD-10-CM | POA: Diagnosis not present

## 2015-02-11 DIAGNOSIS — Z792 Long term (current) use of antibiotics: Secondary | ICD-10-CM | POA: Diagnosis not present

## 2015-02-11 DIAGNOSIS — W01198A Fall on same level from slipping, tripping and stumbling with subsequent striking against other object, initial encounter: Secondary | ICD-10-CM | POA: Insufficient documentation

## 2015-02-11 DIAGNOSIS — I10 Essential (primary) hypertension: Secondary | ICD-10-CM | POA: Insufficient documentation

## 2015-02-11 DIAGNOSIS — Z79899 Other long term (current) drug therapy: Secondary | ICD-10-CM | POA: Diagnosis not present

## 2015-02-11 NOTE — ED Notes (Signed)
Patient with no complaints at this time. Respirations even and unlabored. Skin warm/dry. Discharge instructions reviewed with patient at this time. Patient given opportunity to voice concerns/ask questions. Patient discharged at this time and left Emergency Department with steady gait, accompanied by cousin.

## 2015-02-11 NOTE — ED Notes (Signed)
Pt comes to ED stating he had a fall yesterday in his bathroom and hit his head. Pt sates he generally does not feel well. Pt states he wants to go home and just sleep.

## 2015-02-11 NOTE — Telephone Encounter (Signed)
She did not provide reason for call, just that it was concerning this mutual patient. Please call her back: [phone]

## 2015-02-11 NOTE — Progress Notes (Signed)
[place]  [phone] [phone]) [phone]     ID: Vernon Hayes OB: Mar 18, 1946  MR#: [id number]  MWU#:[id number]  Patient Care Team: Cletis Athens, MD as PCP - General (Internal Medicine)  CHIEF COMPLAINT/DIAGNOSIS:  1.  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 ? Second primary lung cancer (which would make it stage I lung cancer) versus isolated metastasis from known prior history of tongue cancer (which would make it stage IV).  2.  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 Patient recommended induction chemotherapy followed by chemoradiation by head and neck tumor board at [facility]. Status post cycle 1 induction chemo with taxotere/CDDP/5FU. Course complicated by febrile neutropenia, mucositis, renal insufficiency requring prolonged hospitalisation. Completed cycle 2 chemo with Carboplatin/5FU on 12/26/11. Got weekly cisplatin treatment on 01/30/12 - 03/12/12, along with concurrent radiation   HISTORY OF PRESENT ILLNESS:  Patient returns for continued oncology follow-up, he had resection of right upper lung mass which was 1.2 cm size squamous cell carcinoma, margins negative on pathology report. Clinically states that he has gotten weaker after surgery and has not been eating well, he has lost some weight because of this. Otherwise remains physically active. He has chronic intermittent neck and throat pain. Denies any new sore throat or difficulty in swallowing. Eating well. No nausea or vomiting. No diarrhea. No new bone pains.    REVIEW OF SYSTEMS:   ROS As in HPI above. In addition, no fever, chills. No new headaches or focal weakness.  No hemoptysis, has  intermittent chest pain at the site of recent surgery. No dizziness or palpitation. No abdominal pain, constipation, diarrhea, dysuria or hematuria. No new skin rash or bleeding symptoms. No new paresthesias in extremities. PS ECOG 1.  PAST MEDICAL HISTORY: Reviewed. Past Medical History  Diagnosis Date  . GERD (gastroesophageal reflux disease)   . Heart attack ([place]) 1982  . Hypertension   . Cancer ([place]) head and neck  Advanced squamous cell carcinoma of the right base of the tongue diagnosed May 2013 as above  PAST SURGICAL HISTORY: Reviewed. As above.  FAMILY HISTORY: Reviewed. Denies malignancy or hematological disorders.  SOCIAL HISTORY: Reviewed. Has 15-pack-year smoking history, currently quit.  Denies recent alcohol intake, states that he was a heavy drinker in the past.  Denies recreational drug usage.     Allergies  Allergen Reactions  . Tetracyclines & Related Hives    Current Outpatient Prescriptions  Medication Sig Dispense Refill  . bisacodyl (DULCOLAX) 5 MG EC tablet Take 2 tablets (10 mg total) by mouth daily. 30 tablet 0  . gabapentin (NEURONTIN) 300 MG capsule Take 300 mg by mouth at bedtime.  1  . hydrochlorothiazide (HYDRODIURIL) 25 MG tablet Take 25 mg by mouth every morning.   5  . meclizine (ANTIVERT) 25 MG tablet Take 1 tablet (25 mg total) by mouth 3 (three) times daily as needed for dizziness. 90 tablet 1  . permethrin (ELIMITE) 5 % cream APPLY FROM NECK TO FEET AT NIGHT AND THEN WASH OFF IN THE MORNING. REPEAT AFTER 1 WEEK.  1  . megestrol (MEGACE) 400 MG/10ML suspension Take 10 mLs (400 mg total) by mouth daily. 240 mL 1  . oxyCODONE-acetaminophen (PERCOCET/ROXICET) 5-325 MG per tablet     .  Potassium Chloride ER 20 MEQ TBCR      No current facility-administered medications for this visit.    PHYSICAL EXAM: Filed Vitals:   01/25/15 1034  BP: 146/91  Pulse: 83  Temp: 95.4 F (35.2 C)  Resp: 18     Body mass index is 20.69 kg/(m^2).    ECOG FS:1  - Symptomatic but completely ambulatory  GENERAL: Alert and oriented and in no acute distress. No icterus. HEENT: EOMs intact. No cervical lymphadenopathy. CVS: S1S2, regular LUNGS: Bilaterally clear to auscultation, no rhonchi. ABDOMEN: Soft, nontender. No hepatomegaly clinically.  NEURO: grossly nonfocal, cranial nerves are intact.   EXTREMITIES: No pedal edema.   LAB RESULTS:    Component Value Date/Time   NA 137 01/22/2015 0516   NA 144 06/05/2014 1011   K 3.3* 01/22/2015 0516   K 3.7 06/05/2014 1011   CL 99* 01/22/2015 0516   CL 104 06/05/2014 1011   CO2 30 01/22/2015 0516   CO2 32 06/05/2014 1011   GLUCOSE 93 01/22/2015 0516   GLUCOSE 85 06/05/2014 1011   BUN 16 01/22/2015 0516   BUN 11 06/05/2014 1011   CREATININE 1.26* 01/22/2015 0516   CREATININE 1.26 06/05/2014 1011   CALCIUM 9.2 01/22/2015 0516   CALCIUM 9.1 06/05/2014 1011   PROT 6.8 01/20/2015 0613   PROT 7.7 06/05/2014 1011   ALBUMIN 3.6 01/20/2015 0613   ALBUMIN 3.6 06/05/2014 1011   AST 38 01/20/2015 0613   AST 16 06/05/2014 1011   ALT 12* 01/20/2015 0613   ALT 7* 06/05/2014 1011   ALKPHOS 54 01/20/2015 0613   ALKPHOS 84 06/05/2014 1011   BILITOT 1.0 01/20/2015 0613   BILITOT 0.6 06/05/2014 1011   GFRNONAA 56* 01/22/2015 0516   GFRNONAA >60 06/05/2014 1011   GFRNONAA 50* 12/03/2013 1346   GFRAA >60 01/22/2015 0516   GFRAA >60 06/05/2014 1011   GFRAA 58* 12/03/2013 1346    Lab Results  Component Value Date   WBC 6.3 01/22/2015   NEUTROABS 3.9 01/08/2015   HGB 13.2 01/22/2015   HCT 39.7* 01/22/2015   MCV 103.1* 01/22/2015   PLT 133* 01/22/2015    STUDIES: 05/22/14 - CT head. IMPRESSION: 1. No acute or metastatic intracranial abnormality. 2. 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  12/03/13 - CT neck. IMPRESSION: 1. Progressive edematous postradiation changes at the base of tongue and suprahyoid neck without a focal soft tissue mass to suggest residual or  recurrent tumor.  2. This does result in some narrowing of the supraglottic airway. If the patient is experiencing stridor, endoscopy may be useful further evaluation.  3. No significant adenopathy.  4. Moderate spondylosis of the cervical spine is similar to the prior study.  12/17/14 - PET scan.  IMPRESSION:  1. Hypermetabolic 1.5 cm right upper lobe pulmonary nodule, in keeping with a pulmonary metastasis.  2. Asymmetric mildly hypermetabolic focus in the right neck without noncontrast CT or recent contrast-enhanced neck CT nodal correlate, favor physiologic activity.  3. Nonspecific minimally hypermetabolic 5.2 x 2.0 cm subcutaneous mass in the posterior left upper back, unchanged in size and metabolic activity since 32/44/0102 PET-CT, suggestive of a chronic inflammatory lesion such as an inflamed sebaceous cysts. Advise correlation with direct clinical visualization.  4. Otherwise no evidence of potential metastatic disease.  01/19/15 - RUL lung mass wedge resection. Surgical pathology report.  DIAGNOSIS:  A. LUNG, RIGHT UPPER LOBE; WEDGE RESECTION:  SQUAMOUS CELL CARCINOMA.  THE MARGINS OF RESECTION ARE NEGATIVE. SEE  SUMMARY BELOW.  Note: Tumor is 0.4 cm from parenchymal margin on permanent sections. A lung cancer summary is provided as the clinical suspicion appears to favor a lung primary.  Specimens Involved A: Lung, right, upper lobe  Tumor Site:  Upper lobe  Tumor Focality:   Unifocal  TUMOR Histologic Type:  Squamous cell carcinoma  EXTENT  Tumor Size:  Greatest dimension (cm) 1.2 cm  Visceral Pleura Invasion:   Not identified  Tumor Extension:  Not applicable  MARGINS  Bronchial Margin:  Not applicable  Vascular Margin:  Not applicable  Parenchymal Margin: Uninvolved by invasive carcinoma  ACCESSORY FINDINGS  Lymph-Vascular Invasion: Present  STAGE (pTNM)  Primary Tumor (pT):  pT1a: Tumor 2 cm or less in greatest dimension, surrounded by lung or visceral pleura,  without bronchoscopic evidence of invasion more proximal than the lobar bronchus (i.e., not in the main bronchus); or Superficial spreading tumor of any size with its invasive component limited to the bronchial wall, which may extend proximally to the main bronchus  Regional Lymph Nodes (pN)  pNX: Cannot be assessed  No nodes submitted or found  Distant Metastases (pM): Not applicable            ASSESSMENT / PLAN:   1.  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  ? Second primary lung cancer (which would make it stage I lung cancer) versus isolated metastasis from known prior history of tongue cancer (which would make it stage IV)  -  reviewed surgical pathology report and discussed about possibilities with patient. If this is lung cancer then it would be stage IA and no additional treatment needed at this time. If this is metastatic disease, given that there is no other obvious sites of metastatic disease, plan is continued surveillance. Will schedule him for next CT scan of the neck and chest on 03/23/15, and MD follow-up at 1-2 days after scan and make further plan of management. 2.  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  He is status post chemoradiation as described above  -  Have reviewed labs from today and discussed with patient. Pain is under fairly good control. Plan is to continue surveillance, as above. 2. Vertigo - following with ENT. He is on meclizine t.i.d. p.r.n. for this. 3. Dental issues - following with dentist. 4. Loss of appetite, unintentional weight loss - will try Megace 400 mg by mouth daily for appetite stimulation. Patient encouraged to remain physically active and  ambulatory. 5. In between visits, patient advised to call or come to ER in case of any worsening/new symptoms or acute sickness. He is agreeable to this plan.   Leia Alf, MD   02/11/2015 2:51 PM

## 2015-02-11 NOTE — ED Notes (Signed)
Patient arrived by EMS, states he wants to go home.  History of fall last night.  Has multiple identification bracelets on his wrists which he refuses to have removed.  Hospital identification bracelet placed on right wrist with blue sticky note with today's date on it.
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
## 2015-02-12 NOTE — Telephone Encounter (Signed)
Staff that went out to pt house yest. Vernon Hayes that when they got there he was unsteady,stumbling and slurred speech and she called 911 and pt refused and was beligerent to everyone. Then the staff called a family member and they came over and called 47 again and pt was suppose to go there.  The staff from [facility] did say that pt said he had been drinking and she thought maybe the side effects was coming from that but wanted MD here to be aware.  I will give pandit this info and upon further investigation pt did go to ER and did have ct of head and it did not show any issues.  Pt did admit to drinking alcohol to the ER md the day of the fall and the next day which would be 10/6.

## 2015-03-04 ENCOUNTER — Inpatient Hospital Stay: Payer: Medicare Other | Attending: Cardiothoracic Surgery | Admitting: Cardiothoracic Surgery

## 2015-03-23 ENCOUNTER — Ambulatory Visit
Admission: RE | Admit: 2015-03-23 | Discharge: 2015-03-23 | Disposition: A | Discharge status: Home / Self Care | Payer: Medicare Other | Source: Ambulatory Visit | Attending: Internal Medicine | Admitting: Internal Medicine

## 2015-03-23 DIAGNOSIS — Z902 Acquired absence of lung [part of]: Secondary | ICD-10-CM | POA: Insufficient documentation

## 2015-03-23 DIAGNOSIS — K8020 Calculus of gallbladder without cholecystitis without obstruction: Secondary | ICD-10-CM | POA: Insufficient documentation

## 2015-03-23 DIAGNOSIS — C01 Malignant neoplasm of base of tongue: Secondary | ICD-10-CM | POA: Insufficient documentation

## 2015-03-23 DIAGNOSIS — Z8589 Personal history of malignant neoplasm of other organs and systems: Secondary | ICD-10-CM | POA: Insufficient documentation

## 2015-03-23 DIAGNOSIS — I2510 Atherosclerotic heart disease of native coronary artery without angina pectoris: Secondary | ICD-10-CM | POA: Diagnosis not present

## 2015-03-23 DIAGNOSIS — I7090 Unspecified atherosclerosis: Secondary | ICD-10-CM | POA: Diagnosis not present

## 2015-03-23 DIAGNOSIS — R942 Abnormal results of pulmonary function studies: Secondary | ICD-10-CM | POA: Diagnosis present

## 2015-03-23 LAB — POCT I-STAT CREATININE: Creatinine, Ser: 1.5 mg/dL — ABNORMAL HIGH (ref 0.61–1.24)

## 2015-03-23 MED ORDER — IOHEXOL 300 MG/ML  SOLN
75.0000 mL | Freq: Once | INTRAMUSCULAR | Status: AC | PRN
  Administered 2015-03-23: 75 mL via INTRAVENOUS

## 2015-03-24 ENCOUNTER — Encounter: Payer: Self-pay | Admitting: Internal Medicine

## 2015-03-24 ENCOUNTER — Inpatient Hospital Stay: Payer: Medicare Other | Attending: Internal Medicine | Admitting: Internal Medicine

## 2015-03-24 ENCOUNTER — Other Ambulatory Visit: Payer: Self-pay | Admitting: *Deleted

## 2015-03-24 ENCOUNTER — Encounter: Payer: Self-pay | Admitting: *Deleted

## 2015-03-24 ENCOUNTER — Inpatient Hospital Stay: Payer: Medicare Other

## 2015-03-24 VITALS — BP 152/95 | HR 65 | Resp 18 | Ht 72.0 in | Wt 149.5 lb

## 2015-03-24 DIAGNOSIS — K219 Gastro-esophageal reflux disease without esophagitis: Secondary | ICD-10-CM | POA: Insufficient documentation

## 2015-03-24 DIAGNOSIS — Z801 Family history of malignant neoplasm of trachea, bronchus and lung: Secondary | ICD-10-CM | POA: Diagnosis not present

## 2015-03-24 DIAGNOSIS — C3491 Malignant neoplasm of unspecified part of right bronchus or lung: Secondary | ICD-10-CM

## 2015-03-24 DIAGNOSIS — R42 Dizziness and giddiness: Secondary | ICD-10-CM | POA: Diagnosis not present

## 2015-03-24 DIAGNOSIS — E876 Hypokalemia: Secondary | ICD-10-CM | POA: Diagnosis not present

## 2015-03-24 DIAGNOSIS — N189 Chronic kidney disease, unspecified: Secondary | ICD-10-CM | POA: Diagnosis not present

## 2015-03-24 DIAGNOSIS — Z85810 Personal history of malignant neoplasm of tongue: Secondary | ICD-10-CM | POA: Insufficient documentation

## 2015-03-24 DIAGNOSIS — Z9181 History of falling: Secondary | ICD-10-CM | POA: Insufficient documentation

## 2015-03-24 DIAGNOSIS — R531 Weakness: Secondary | ICD-10-CM

## 2015-03-24 DIAGNOSIS — Z923 Personal history of irradiation: Secondary | ICD-10-CM | POA: Insufficient documentation

## 2015-03-24 DIAGNOSIS — Z79899 Other long term (current) drug therapy: Secondary | ICD-10-CM | POA: Insufficient documentation

## 2015-03-24 DIAGNOSIS — I252 Old myocardial infarction: Secondary | ICD-10-CM | POA: Insufficient documentation

## 2015-03-24 DIAGNOSIS — I129 Hypertensive chronic kidney disease with stage 1 through stage 4 chronic kidney disease, or unspecified chronic kidney disease: Secondary | ICD-10-CM | POA: Diagnosis not present

## 2015-03-24 DIAGNOSIS — F17210 Nicotine dependence, cigarettes, uncomplicated: Secondary | ICD-10-CM | POA: Diagnosis not present

## 2015-03-24 DIAGNOSIS — C3411 Malignant neoplasm of upper lobe, right bronchus or lung: Secondary | ICD-10-CM | POA: Diagnosis present

## 2015-03-24 DIAGNOSIS — G8928 Other chronic postprocedural pain: Secondary | ICD-10-CM | POA: Insufficient documentation

## 2015-03-24 DIAGNOSIS — R918 Other nonspecific abnormal finding of lung field: Secondary | ICD-10-CM

## 2015-03-24 DIAGNOSIS — C01 Malignant neoplasm of base of tongue: Secondary | ICD-10-CM

## 2015-03-24 DIAGNOSIS — G8912 Acute post-thoracotomy pain: Secondary | ICD-10-CM

## 2015-03-24 LAB — CBC WITH DIFFERENTIAL/PLATELET
BASOS ABS: 0 10*3/uL (ref 0–0.1)
Basophils Relative: 1 %
EOS ABS: 0.2 10*3/uL (ref 0–0.7)
EOS PCT: 3 %
HCT: 40 % (ref 40.0–52.0)
Hemoglobin: 13.5 g/dL (ref 13.0–18.0)
Lymphocytes Relative: 19 %
Lymphs Abs: 1.2 10*3/uL (ref 1.0–3.6)
MCH: 34.4 pg — ABNORMAL HIGH (ref 26.0–34.0)
MCHC: 33.8 g/dL (ref 32.0–36.0)
MCV: 101.8 fL — ABNORMAL HIGH (ref 80.0–100.0)
Monocytes Absolute: 0.8 10*3/uL (ref 0.2–1.0)
Monocytes Relative: 13 %
Neutro Abs: 4 10*3/uL (ref 1.4–6.5)
Neutrophils Relative %: 64 %
PLATELETS: 167 10*3/uL (ref 150–440)
RBC: 3.93 MIL/uL — AB (ref 4.40–5.90)
RDW: 15.5 % — ABNORMAL HIGH (ref 11.5–14.5)
WBC: 6.2 10*3/uL (ref 3.8–10.6)

## 2015-03-24 LAB — COMPREHENSIVE METABOLIC PANEL
ALT: 22 U/L (ref 17–63)
AST: 33 U/L (ref 15–41)
Albumin: 4.3 g/dL (ref 3.5–5.0)
Alkaline Phosphatase: 83 U/L (ref 38–126)
Anion gap: 10 (ref 5–15)
BUN: 18 mg/dL (ref 6–20)
CHLORIDE: 96 mmol/L — AB (ref 101–111)
CO2: 30 mmol/L (ref 22–32)
CREATININE: 1.35 mg/dL — AB (ref 0.61–1.24)
Calcium: 9.5 mg/dL (ref 8.9–10.3)
GFR calc Af Amer: 59 mL/min — ABNORMAL LOW (ref >60)
GFR calc non Af Amer: 51 mL/min — ABNORMAL LOW (ref >60)
Glucose, Bld: 99 mg/dL (ref 65–99)
Potassium: 3 mmol/L — ABNORMAL LOW (ref 3.5–5.1)
SODIUM: 136 mmol/L (ref 135–145)
Total Bilirubin: 0.8 mg/dL (ref 0.3–1.2)
Total Protein: 8.3 g/dL — ABNORMAL HIGH (ref 6.5–8.1)

## 2015-03-24 MED ORDER — HEPARIN SOD (PORK) LOCK FLUSH 100 UNIT/ML IV SOLN
500.0000 [IU] | Freq: Once | INTRAVENOUS | Status: AC
  Administered 2015-03-24: 500 [IU] via INTRAVENOUS

## 2015-03-24 MED ORDER — GABAPENTIN 300 MG PO CAPS: 300.0000 mg | ORAL_CAPSULE | Freq: Two times a day (BID) | ORAL | Status: DC

## 2015-03-24 MED ORDER — SODIUM CHLORIDE 0.9 % IJ SOLN
10.0000 mL | INTRAMUSCULAR | Status: DC | PRN
  Administered 2015-03-24: 10 mL via INTRAVENOUS
  Filled 2015-03-24: qty 10

## 2015-03-24 MED ORDER — OXYCODONE HCL 5 MG PO TABS: 5.0000 mg | ORAL_TABLET | Freq: Three times a day (TID) | ORAL | Status: DC | PRN

## 2015-03-24 MED ORDER — POTASSIUM CHLORIDE ER 20 MEQ PO TBCR: 1.0000 | EXTENDED_RELEASE_TABLET | Freq: Two times a day (BID) | ORAL | Status: DC

## 2015-03-24 MED ORDER — HEPARIN SOD (PORK) LOCK FLUSH 100 UNIT/ML IV SOLN
INTRAVENOUS | Status: AC
Start: 2015-03-24 — End: 2015-03-24
  Filled 2015-03-24: qty 5

## 2015-03-24 NOTE — Progress Notes (Signed)
Patient is here for follow-up of lung cancer and CT Scan results. Patient states that he is still having pain in the area of his surgery. He rates his pain a 4/10 today. He has also been having trouble with vertigo. He has had it off and on for a while, but states that it has been pretty bad lately. He fell a couple of weeks ago at his house and thinks that he may have broke his nose.
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
## 2015-04-19 ENCOUNTER — Telehealth: Payer: Self-pay | Admitting: *Deleted

## 2015-04-19 NOTE — Telephone Encounter (Signed)
Pt called Tia the scheduler needing refill of dizziness medication. Meclizine was called in as refill for him to [facility].  Tia will let pt know it has been called in

## 2015-05-05 ENCOUNTER — Inpatient Hospital Stay: Payer: Medicare Other | Attending: Internal Medicine

## 2015-05-05 ENCOUNTER — Inpatient Hospital Stay: Payer: Medicare Other

## 2015-05-05 DIAGNOSIS — C801 Malignant (primary) neoplasm, unspecified: Secondary | ICD-10-CM

## 2015-05-05 DIAGNOSIS — Z9221 Personal history of antineoplastic chemotherapy: Secondary | ICD-10-CM | POA: Diagnosis not present

## 2015-05-05 DIAGNOSIS — Z452 Encounter for adjustment and management of vascular access device: Secondary | ICD-10-CM | POA: Diagnosis not present

## 2015-05-05 DIAGNOSIS — C3411 Malignant neoplasm of upper lobe, right bronchus or lung: Secondary | ICD-10-CM | POA: Diagnosis present

## 2015-05-05 DIAGNOSIS — F17210 Nicotine dependence, cigarettes, uncomplicated: Secondary | ICD-10-CM | POA: Insufficient documentation

## 2015-05-05 DIAGNOSIS — Z923 Personal history of irradiation: Secondary | ICD-10-CM | POA: Insufficient documentation

## 2015-05-05 DIAGNOSIS — Z85810 Personal history of malignant neoplasm of tongue: Secondary | ICD-10-CM | POA: Diagnosis not present

## 2015-05-05 MED ORDER — SODIUM CHLORIDE 0.9 % IJ SOLN
10.0000 mL | INTRAMUSCULAR | Status: DC | PRN
  Administered 2015-05-05: 10 mL via INTRAVENOUS
  Filled 2015-05-05: qty 10

## 2015-05-05 MED ORDER — HEPARIN SOD (PORK) LOCK FLUSH 100 UNIT/ML IV SOLN
500.0000 [IU] | Freq: Once | INTRAVENOUS | Status: AC
  Administered 2015-05-05: 500 [IU] via INTRAVENOUS
  Filled 2015-05-05: qty 5

## 2015-05-26 ENCOUNTER — Other Ambulatory Visit: Payer: Medicare Other

## 2015-05-26 ENCOUNTER — Ambulatory Visit: Payer: Medicare Other | Admitting: Internal Medicine

## 2015-06-25 ENCOUNTER — Ambulatory Visit
Admission: RE | Admit: 2015-06-25 | Discharge: 2015-06-25 | Disposition: A | Discharge status: Home / Self Care | Payer: Medicare Other | Source: Ambulatory Visit | Attending: Internal Medicine | Admitting: Internal Medicine

## 2015-06-25 DIAGNOSIS — Z9889 Other specified postprocedural states: Secondary | ICD-10-CM | POA: Diagnosis not present

## 2015-06-25 DIAGNOSIS — K8020 Calculus of gallbladder without cholecystitis without obstruction: Secondary | ICD-10-CM | POA: Insufficient documentation

## 2015-06-25 DIAGNOSIS — G8912 Acute post-thoracotomy pain: Secondary | ICD-10-CM | POA: Insufficient documentation

## 2015-06-25 DIAGNOSIS — I700 Atherosclerosis of aorta: Secondary | ICD-10-CM | POA: Diagnosis not present

## 2015-06-25 DIAGNOSIS — C3411 Malignant neoplasm of upper lobe, right bronchus or lung: Secondary | ICD-10-CM | POA: Insufficient documentation

## 2015-06-25 DIAGNOSIS — R918 Other nonspecific abnormal finding of lung field: Secondary | ICD-10-CM | POA: Diagnosis not present

## 2015-06-25 DIAGNOSIS — C01 Malignant neoplasm of base of tongue: Secondary | ICD-10-CM | POA: Insufficient documentation

## 2015-06-28 ENCOUNTER — Inpatient Hospital Stay: Payer: Medicare Other | Attending: Internal Medicine

## 2015-06-28 ENCOUNTER — Inpatient Hospital Stay (HOSPITAL_BASED_OUTPATIENT_CLINIC_OR_DEPARTMENT_OTHER): Payer: Medicare Other | Admitting: Internal Medicine

## 2015-06-28 ENCOUNTER — Inpatient Hospital Stay: Payer: Medicare Other

## 2015-06-28 ENCOUNTER — Ambulatory Visit: Payer: Medicare Other | Admitting: Internal Medicine

## 2015-06-28 VITALS — BP 153/115 | HR 80 | Temp 95.4°F | Resp 18 | Ht 72.0 in | Wt 151.9 lb

## 2015-06-28 DIAGNOSIS — R918 Other nonspecific abnormal finding of lung field: Secondary | ICD-10-CM

## 2015-06-28 DIAGNOSIS — G8922 Chronic post-thoracotomy pain: Secondary | ICD-10-CM

## 2015-06-28 DIAGNOSIS — R42 Dizziness and giddiness: Secondary | ICD-10-CM | POA: Insufficient documentation

## 2015-06-28 DIAGNOSIS — I129 Hypertensive chronic kidney disease with stage 1 through stage 4 chronic kidney disease, or unspecified chronic kidney disease: Secondary | ICD-10-CM | POA: Diagnosis not present

## 2015-06-28 DIAGNOSIS — Z923 Personal history of irradiation: Secondary | ICD-10-CM

## 2015-06-28 DIAGNOSIS — F17210 Nicotine dependence, cigarettes, uncomplicated: Secondary | ICD-10-CM | POA: Insufficient documentation

## 2015-06-28 DIAGNOSIS — K219 Gastro-esophageal reflux disease without esophagitis: Secondary | ICD-10-CM | POA: Insufficient documentation

## 2015-06-28 DIAGNOSIS — Z801 Family history of malignant neoplasm of trachea, bronchus and lung: Secondary | ICD-10-CM | POA: Diagnosis not present

## 2015-06-28 DIAGNOSIS — N189 Chronic kidney disease, unspecified: Secondary | ICD-10-CM

## 2015-06-28 DIAGNOSIS — Z85810 Personal history of malignant neoplasm of tongue: Secondary | ICD-10-CM | POA: Diagnosis not present

## 2015-06-28 DIAGNOSIS — G8912 Acute post-thoracotomy pain: Secondary | ICD-10-CM

## 2015-06-28 DIAGNOSIS — Z85118 Personal history of other malignant neoplasm of bronchus and lung: Secondary | ICD-10-CM | POA: Insufficient documentation

## 2015-06-28 DIAGNOSIS — Z9181 History of falling: Secondary | ICD-10-CM

## 2015-06-28 DIAGNOSIS — C01 Malignant neoplasm of base of tongue: Secondary | ICD-10-CM

## 2015-06-28 DIAGNOSIS — Z79899 Other long term (current) drug therapy: Secondary | ICD-10-CM | POA: Diagnosis not present

## 2015-06-28 DIAGNOSIS — Z9221 Personal history of antineoplastic chemotherapy: Secondary | ICD-10-CM | POA: Insufficient documentation

## 2015-06-28 DIAGNOSIS — I252 Old myocardial infarction: Secondary | ICD-10-CM | POA: Insufficient documentation

## 2015-06-28 DIAGNOSIS — C3491 Malignant neoplasm of unspecified part of right bronchus or lung: Secondary | ICD-10-CM

## 2015-06-28 DIAGNOSIS — C3411 Malignant neoplasm of upper lobe, right bronchus or lung: Secondary | ICD-10-CM

## 2015-06-28 LAB — CBC WITH DIFFERENTIAL/PLATELET
BASOS ABS: 0 10*3/uL (ref 0–0.1)
BASOS PCT: 1 %
Eosinophils Absolute: 0.1 10*3/uL (ref 0–0.7)
Eosinophils Relative: 2 %
HEMATOCRIT: 38.5 % — AB (ref 40.0–52.0)
Hemoglobin: 13.4 g/dL (ref 13.0–18.0)
Lymphocytes Relative: 19 %
Lymphs Abs: 0.8 10*3/uL — ABNORMAL LOW (ref 1.0–3.6)
MCH: 36.7 pg — ABNORMAL HIGH (ref 26.0–34.0)
MCHC: 34.8 g/dL (ref 32.0–36.0)
MCV: 105.5 fL — ABNORMAL HIGH (ref 80.0–100.0)
MONO ABS: 0.7 10*3/uL (ref 0.2–1.0)
Monocytes Relative: 17 %
NEUTROS ABS: 2.4 10*3/uL (ref 1.4–6.5)
Neutrophils Relative %: 61 %
PLATELETS: 163 10*3/uL (ref 150–440)
RBC: 3.65 MIL/uL — AB (ref 4.40–5.90)
RDW: 14.3 % (ref 11.5–14.5)
WBC: 3.9 10*3/uL (ref 3.8–10.6)

## 2015-06-28 LAB — COMPREHENSIVE METABOLIC PANEL
ALBUMIN: 3.9 g/dL (ref 3.5–5.0)
ALT: 25 U/L (ref 17–63)
ANION GAP: 6 (ref 5–15)
AST: 37 U/L (ref 15–41)
Alkaline Phosphatase: 92 U/L (ref 38–126)
BILIRUBIN TOTAL: 0.6 mg/dL (ref 0.3–1.2)
BUN: 15 mg/dL (ref 6–20)
CHLORIDE: 100 mmol/L — AB (ref 101–111)
CO2: 30 mmol/L (ref 22–32)
Calcium: 9.4 mg/dL (ref 8.9–10.3)
Creatinine, Ser: 1.28 mg/dL — ABNORMAL HIGH (ref 0.61–1.24)
GFR calc non Af Amer: 55 mL/min — ABNORMAL LOW (ref >60)
Glucose, Bld: 110 mg/dL — ABNORMAL HIGH (ref 65–99)
POTASSIUM: 3.1 mmol/L — AB (ref 3.5–5.1)
Sodium: 136 mmol/L (ref 135–145)
Total Protein: 7.4 g/dL (ref 6.5–8.1)

## 2015-06-28 MED ORDER — SODIUM CHLORIDE 0.9% FLUSH
10.0000 mL | Freq: Once | INTRAVENOUS | Status: AC
  Administered 2015-06-28: 10 mL via INTRAVENOUS
  Filled 2015-06-28: qty 10

## 2015-06-28 MED ORDER — HEPARIN SOD (PORK) LOCK FLUSH 100 UNIT/ML IV SOLN
500.0000 [IU] | Freq: Once | INTRAVENOUS | Status: AC
  Administered 2015-06-28: 500 [IU] via INTRAVENOUS
  Filled 2015-06-28: qty 5

## 2015-06-28 MED ORDER — OXYCODONE HCL 5 MG PO TABS: 5.0000 mg | ORAL_TABLET | Freq: Three times a day (TID) | ORAL | Status: DC | PRN
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
## 2015-07-14 ENCOUNTER — Other Ambulatory Visit: Payer: Self-pay | Admitting: Internal Medicine

## 2015-07-14 ENCOUNTER — Inpatient Hospital Stay: Payer: Medicare Other

## 2015-07-14 ENCOUNTER — Ambulatory Visit
Admission: RE | Admit: 2015-07-14 | Discharge: 2015-07-14 | Disposition: A | Discharge status: Home / Self Care | Payer: Medicare Other | Source: Ambulatory Visit | Attending: Internal Medicine | Admitting: Internal Medicine

## 2015-07-14 DIAGNOSIS — G319 Degenerative disease of nervous system, unspecified: Secondary | ICD-10-CM | POA: Diagnosis not present

## 2015-07-14 DIAGNOSIS — I639 Cerebral infarction, unspecified: Secondary | ICD-10-CM | POA: Insufficient documentation

## 2015-07-14 DIAGNOSIS — R9089 Other abnormal findings on diagnostic imaging of central nervous system: Secondary | ICD-10-CM | POA: Diagnosis not present

## 2015-07-14 DIAGNOSIS — R9082 White matter disease, unspecified: Secondary | ICD-10-CM | POA: Insufficient documentation

## 2015-07-14 DIAGNOSIS — C3491 Malignant neoplasm of unspecified part of right bronchus or lung: Secondary | ICD-10-CM

## 2015-07-14 MED ORDER — GADOBENATE DIMEGLUMINE 529 MG/ML IV SOLN: 15.0000 mL | Freq: Once | INTRAVENOUS | Status: DC | PRN

## 2015-07-27 ENCOUNTER — Telehealth: Payer: Self-pay | Admitting: *Deleted

## 2015-07-27 NOTE — Telephone Encounter (Signed)
Patient notified that his MRI results did not show any signs of cancer. He states "I still do not understand why I am dizzy all the time. I swear that I have had a stroke or vertigo in the past that would explain my numbness in my hands." I explained to the patient that from "cancer point of view, the scan did not demonstrate any signs of cancer." The pt needs to make an appointment with Dr. Lavera Hayes, his primary care, to discuss his symptoms of persistent dizziness/vertigo.  The patient states that "I think Dr. Rogue Hayes needs to make this appointment and that the [facility] needs to bring me to the appointment." I explained to the patient that our [facility] can not transport him to any other doctor's appointment. I provided him with [facility] phone # at [phone] and Dr. Jennette Hayes office #. Dr. Rogue Hayes wants the patient to make his own appointment back to his pmd.  He also stated that "no one had gone over my previous ct scan of his lung prior to this call. "How am I supposed to know how my cancer is doing when you haven't gone over this information." I explained to patient that md went over the ct scan results at the last office visit on 06/28/15. I reassured pt that there was no sign at that time for metastatic or progressive disease on the ct scan.  He stated, "i think I remember that now."  He thanked me for calling him.

## 2015-07-27 NOTE — Telephone Encounter (Signed)
Today's Phone note today and mri brain result fwd/routed via fax through [facility] to Dr. Lavera Guise

## 2015-07-27 NOTE — Telephone Encounter (Signed)
-----   Message from Cammie Sickle, MD sent at 07/26/2015  5:12 PM EDT ----- MRI brain negative for cancer. Reviewed the imaging with neurology; no concerns for any acute process. Patient is to follow up with PCP; please fax a copy of the brain MRI  to PCPs office.

## 2015-08-20 ENCOUNTER — Emergency Department: Payer: Medicare Other

## 2015-08-20 ENCOUNTER — Emergency Department
Admission: EM | Admit: 2015-08-20 | Discharge: 2015-08-20 | Disposition: A | Discharge status: Home / Self Care | Payer: Medicare Other | Attending: Emergency Medicine | Admitting: Emergency Medicine

## 2015-08-20 DIAGNOSIS — I10 Essential (primary) hypertension: Secondary | ICD-10-CM | POA: Diagnosis not present

## 2015-08-20 DIAGNOSIS — C801 Malignant (primary) neoplasm, unspecified: Secondary | ICD-10-CM | POA: Diagnosis not present

## 2015-08-20 DIAGNOSIS — I252 Old myocardial infarction: Secondary | ICD-10-CM | POA: Insufficient documentation

## 2015-08-20 DIAGNOSIS — R296 Repeated falls: Secondary | ICD-10-CM | POA: Diagnosis not present

## 2015-08-20 DIAGNOSIS — C3491 Malignant neoplasm of unspecified part of right bronchus or lung: Secondary | ICD-10-CM | POA: Insufficient documentation

## 2015-08-20 DIAGNOSIS — R42 Dizziness and giddiness: Secondary | ICD-10-CM | POA: Diagnosis present

## 2015-08-20 DIAGNOSIS — K219 Gastro-esophageal reflux disease without esophagitis: Secondary | ICD-10-CM | POA: Insufficient documentation

## 2015-08-20 DIAGNOSIS — Z79899 Other long term (current) drug therapy: Secondary | ICD-10-CM | POA: Insufficient documentation

## 2015-08-20 DIAGNOSIS — W19XXXA Unspecified fall, initial encounter: Secondary | ICD-10-CM

## 2015-08-20 DIAGNOSIS — F17210 Nicotine dependence, cigarettes, uncomplicated: Secondary | ICD-10-CM | POA: Diagnosis not present

## 2015-08-20 LAB — COMPREHENSIVE METABOLIC PANEL
ALK PHOS: 72 U/L (ref 38–126)
ALT: 20 U/L (ref 17–63)
AST: 42 U/L — AB (ref 15–41)
Albumin: 3.9 g/dL (ref 3.5–5.0)
Anion gap: 11 (ref 5–15)
BILIRUBIN TOTAL: 1.2 mg/dL (ref 0.3–1.2)
BUN: 19 mg/dL (ref 6–20)
CALCIUM: 10.1 mg/dL (ref 8.9–10.3)
CHLORIDE: 98 mmol/L — AB (ref 101–111)
CO2: 27 mmol/L (ref 22–32)
CREATININE: 1.35 mg/dL — AB (ref 0.61–1.24)
GFR calc Af Amer: 59 mL/min — ABNORMAL LOW (ref >60)
GFR, EST NON AFRICAN AMERICAN: 51 mL/min — AB (ref >60)
Glucose, Bld: 118 mg/dL — ABNORMAL HIGH (ref 65–99)
Potassium: 3 mmol/L — ABNORMAL LOW (ref 3.5–5.1)
Sodium: 136 mmol/L (ref 135–145)
Total Protein: 7.4 g/dL (ref 6.5–8.1)

## 2015-08-20 LAB — CBC
HEMATOCRIT: 36.5 % — AB (ref 40.0–52.0)
HEMOGLOBIN: 12.6 g/dL — AB (ref 13.0–18.0)
MCH: 36.2 pg — AB (ref 26.0–34.0)
MCHC: 34.6 g/dL (ref 32.0–36.0)
MCV: 104.5 fL — AB (ref 80.0–100.0)
PLATELETS: 129 10*3/uL — AB (ref 150–440)
RBC: 3.49 MIL/uL — ABNORMAL LOW (ref 4.40–5.90)
RDW: 15.6 % — ABNORMAL HIGH (ref 11.5–14.5)
WBC: 5.7 10*3/uL (ref 3.8–10.6)

## 2015-08-20 LAB — URINALYSIS COMPLETE WITH MICROSCOPIC (ARMC ONLY)
BILIRUBIN URINE: NEGATIVE
Glucose, UA: NEGATIVE mg/dL
Hgb urine dipstick: NEGATIVE
Ketones, ur: NEGATIVE mg/dL
Leukocytes, UA: NEGATIVE
Nitrite: NEGATIVE
PH: 5 (ref 5.0–8.0)
PROTEIN: NEGATIVE mg/dL
Specific Gravity, Urine: 1.018 (ref 1.005–1.030)

## 2015-08-20 LAB — TROPONIN I: Troponin I: <0.03 ng/mL (ref <0.031)

## 2015-08-20 MED ORDER — HEPARIN SOD (PORK) LOCK FLUSH 100 UNIT/ML IV SOLN
INTRAVENOUS | Status: AC
  Administered 2015-08-20: 500 [IU] via INTRAVENOUS
  Filled 2015-08-20: qty 5

## 2015-08-20 MED ORDER — HEPARIN SOD (PORK) LOCK FLUSH 100 UNIT/ML IV SOLN
500.0000 [IU] | Freq: Once | INTRAVENOUS | Status: AC
  Administered 2015-08-20: 500 [IU] via INTRAVENOUS
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
## 2015-08-20 NOTE — ED Notes (Signed)
Pt's brother, elliot, contacted to pick up pt.  Tiffany Kocher states he will pick up.  Pt to be taken to lobby to wait for brother

## 2015-08-20 NOTE — ED Notes (Signed)
Pt from home via EMS reports he keeps falling, reports generalized weakness and dizziness. Hx of vertigo. Reports he hit head but did not lose consciousness

## 2015-08-20 NOTE — Discharge Instructions (Signed)
Fall Prevention in the Home  Falls can cause injuries and can affect people from all age groups. There are many simple things that you can do to make your home safe and to help prevent falls. WHAT CAN I DO ON THE OUTSIDE OF MY HOME?  Regularly repair the edges of walkways and driveways and fix any cracks.  Remove high doorway thresholds.  Trim any shrubbery on the main path into your home.  Use bright outdoor lighting.  Clear walkways of debris and clutter, including tools and rocks.  Regularly check that handrails are securely fastened and in good repair. Both sides of any steps should have handrails.  Install guardrails along the edges of any raised decks or porches.  Have leaves, snow, and ice cleared regularly.  Use sand or salt on walkways during winter months.  In the garage, clean up any spills right away, including grease or oil spills. WHAT CAN I DO IN THE BATHROOM?  Use night lights.  Install grab bars by the toilet and in the tub and shower. Do not use towel bars as grab bars.  Use non-skid mats or decals on the floor of the tub or shower.  If you need to sit down while you are in the shower, use a plastic, non-slip stool..  Keep the floor dry. Immediately clean up any water that spills on the floor.  Remove soap buildup in the tub or shower on a regular basis.  Attach bath mats securely with double-sided non-slip rug tape.  Remove throw rugs and other tripping hazards from the floor. WHAT CAN I DO IN THE BEDROOM?  Use night lights.  Make sure that a bedside light is easy to reach.  Do not use oversized bedding that drapes onto the floor.  Have a firm chair that has side arms to use for getting dressed.  Remove throw rugs and other tripping hazards from the floor. WHAT CAN I DO IN THE KITCHEN?   Clean up any spills right away.  Avoid walking on wet floors.  Place frequently used items in easy-to-reach places.  If you need to reach for something  above you, use a sturdy step stool that has a grab bar.  Keep electrical cables out of the way.  Do not use floor polish or wax that makes floors slippery. If you have to use wax, make sure that it is non-skid floor wax.  Remove throw rugs and other tripping hazards from the floor. WHAT CAN I DO IN THE STAIRWAYS?  Do not leave any items on the stairs.  Make sure that there are handrails on both sides of the stairs. Fix handrails that are broken or loose. Make sure that handrails are as long as the stairways.  Check any carpeting to make sure that it is firmly attached to the stairs. Fix any carpet that is loose or worn.  Avoid having throw rugs at the top or bottom of stairways, or secure the rugs with carpet tape to prevent them from moving.  Make sure that you have a light switch at the top of the stairs and the bottom of the stairs. If you do not have them, have them installed. WHAT ARE SOME OTHER FALL PREVENTION TIPS?  Wear closed-toe shoes that fit well and support your feet. Wear shoes that have rubber soles or low heels.  When you use a stepladder, make sure that it is completely opened and that the sides are firmly locked. Have someone hold the ladder while you   are using it. Do not climb a closed stepladder.  Add color or contrast paint or tape to grab bars and handrails in your home. Place contrasting color strips on the first and last steps.  Use mobility aids as needed, such as canes, walkers, scooters, and crutches.  Turn on lights if it is dark. Replace any light bulbs that burn out.  Set up furniture so that there are clear paths. Keep the furniture in the same spot.  Fix any uneven floor surfaces.  Choose a carpet design that does not hide the edge of steps of a stairway.  Be aware of any and all pets.  Review your medicines with your healthcare provider. Some medicines can cause dizziness or changes in blood pressure, which increase your risk of falling. Talk  with your health care provider about other ways that you can decrease your risk of falls. This may include working with a physical therapist or trainer to improve your strength, balance, and endurance.   This information is not intended to replace advice given to you by your health care provider. Make sure you discuss any questions you have with your health care provider.   Document Released: 04/14/2002 Document Revised: 09/08/2014 Document Reviewed: 05/29/2014 Elsevier Interactive Patient Education 2016 Elsevier Inc.  

## 2015-09-02 ENCOUNTER — Other Ambulatory Visit: Payer: Self-pay | Admitting: Internal Medicine

## 2015-09-02 ENCOUNTER — Ambulatory Visit
Admission: RE | Admit: 2015-09-02 | Discharge: 2015-09-02 | Disposition: A | Discharge status: Home / Self Care | Payer: Medicare Other | Source: Ambulatory Visit | Attending: Internal Medicine | Admitting: Internal Medicine

## 2015-09-02 DIAGNOSIS — T1490XA Injury, unspecified, initial encounter: Secondary | ICD-10-CM

## 2015-09-02 DIAGNOSIS — S4992XA Unspecified injury of left shoulder and upper arm, initial encounter: Secondary | ICD-10-CM | POA: Diagnosis present

## 2015-09-02 DIAGNOSIS — M898X1 Other specified disorders of bone, shoulder: Secondary | ICD-10-CM | POA: Insufficient documentation

## 2015-09-02 DIAGNOSIS — Z9181 History of falling: Secondary | ICD-10-CM

## 2015-09-02 DIAGNOSIS — W1830XA Fall on same level, unspecified, initial encounter: Secondary | ICD-10-CM | POA: Insufficient documentation

## 2015-09-23 ENCOUNTER — Emergency Department: Payer: Medicare Other

## 2015-09-23 ENCOUNTER — Encounter: Payer: Self-pay | Admitting: Emergency Medicine

## 2015-09-23 ENCOUNTER — Emergency Department (EMERGENCY_DEPARTMENT_HOSPITAL)
Admission: EM | Admit: 2015-09-23 | Discharge: 2015-09-24 | Disposition: A | Discharge status: Home / Self Care | Payer: Medicare Other | Source: Home / Self Care | Attending: Emergency Medicine | Admitting: Emergency Medicine

## 2015-09-23 DIAGNOSIS — R42 Dizziness and giddiness: Secondary | ICD-10-CM

## 2015-09-23 DIAGNOSIS — F17210 Nicotine dependence, cigarettes, uncomplicated: Secondary | ICD-10-CM

## 2015-09-23 DIAGNOSIS — Z85828 Personal history of other malignant neoplasm of skin: Secondary | ICD-10-CM | POA: Insufficient documentation

## 2015-09-23 DIAGNOSIS — W19XXXA Unspecified fall, initial encounter: Secondary | ICD-10-CM | POA: Insufficient documentation

## 2015-09-23 DIAGNOSIS — F068 Other specified mental disorders due to known physiological condition: Secondary | ICD-10-CM | POA: Diagnosis not present

## 2015-09-23 DIAGNOSIS — Y999 Unspecified external cause status: Secondary | ICD-10-CM

## 2015-09-23 DIAGNOSIS — I10 Essential (primary) hypertension: Secondary | ICD-10-CM | POA: Insufficient documentation

## 2015-09-23 DIAGNOSIS — R531 Weakness: Secondary | ICD-10-CM

## 2015-09-23 DIAGNOSIS — Z85118 Personal history of other malignant neoplasm of bronchus and lung: Secondary | ICD-10-CM | POA: Insufficient documentation

## 2015-09-23 DIAGNOSIS — Y939 Activity, unspecified: Secondary | ICD-10-CM

## 2015-09-23 DIAGNOSIS — E440 Moderate protein-calorie malnutrition: Secondary | ICD-10-CM | POA: Diagnosis present

## 2015-09-23 DIAGNOSIS — S0990XA Unspecified injury of head, initial encounter: Secondary | ICD-10-CM

## 2015-09-23 DIAGNOSIS — Z79899 Other long term (current) drug therapy: Secondary | ICD-10-CM | POA: Insufficient documentation

## 2015-09-23 DIAGNOSIS — Y92019 Unspecified place in single-family (private) house as the place of occurrence of the external cause: Secondary | ICD-10-CM | POA: Insufficient documentation

## 2015-09-23 DIAGNOSIS — F0280 Dementia in other diseases classified elsewhere without behavioral disturbance: Secondary | ICD-10-CM

## 2015-09-23 HISTORY — DX: Alcohol abuse, uncomplicated: F10.10

## 2015-09-23 LAB — COMPREHENSIVE METABOLIC PANEL
ALK PHOS: 49 U/L (ref 38–126)
ALT: 12 U/L — AB (ref 17–63)
AST: 17 U/L (ref 15–41)
Albumin: 3.6 g/dL (ref 3.5–5.0)
Anion gap: 11 (ref 5–15)
BILIRUBIN TOTAL: 1.7 mg/dL — AB (ref 0.3–1.2)
BUN: 23 mg/dL — AB (ref 6–20)
CALCIUM: 11.5 mg/dL — AB (ref 8.9–10.3)
CHLORIDE: 102 mmol/L (ref 101–111)
CO2: 26 mmol/L (ref 22–32)
CREATININE: 1.09 mg/dL (ref 0.61–1.24)
Glucose, Bld: 95 mg/dL (ref 65–99)
Potassium: 3.4 mmol/L — ABNORMAL LOW (ref 3.5–5.1)
Sodium: 139 mmol/L (ref 135–145)
TOTAL PROTEIN: 7.1 g/dL (ref 6.5–8.1)

## 2015-09-23 LAB — URINALYSIS COMPLETE WITH MICROSCOPIC (ARMC ONLY)
BACTERIA UA: NONE SEEN
GLUCOSE, UA: NEGATIVE mg/dL
LEUKOCYTES UA: NEGATIVE
Nitrite: NEGATIVE
Protein, ur: 30 mg/dL — AB
SPECIFIC GRAVITY, URINE: 1.029 (ref 1.005–1.030)
pH: 5 (ref 5.0–8.0)

## 2015-09-23 LAB — CBC
HEMATOCRIT: 39.3 % — AB (ref 40.0–52.0)
HEMOGLOBIN: 13.4 g/dL (ref 13.0–18.0)
MCH: 35.8 pg — AB (ref 26.0–34.0)
MCHC: 34.1 g/dL (ref 32.0–36.0)
MCV: 105.1 fL — AB (ref 80.0–100.0)
PLATELETS: 183 10*3/uL (ref 150–440)
RBC: 3.74 MIL/uL — AB (ref 4.40–5.90)
RDW: 14.5 % (ref 11.5–14.5)
WBC: 6.2 10*3/uL (ref 3.8–10.6)

## 2015-09-23 LAB — CK: CK TOTAL: 54 U/L (ref 49–397)

## 2015-09-23 LAB — TROPONIN I
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
## 2015-09-23 NOTE — ED Notes (Signed)
MD Clapacs at bedside

## 2015-09-23 NOTE — ED Notes (Signed)
Pt found lying in stool, unaware that he had soiled himself. Pt and changed and cleaned

## 2015-09-23 NOTE — ED Notes (Signed)
EMS was called to patient's house by [employer] service due to patient was heard in the house but hasn't answered his doors or gotten food off the doorstep in 2 days.  On EMS arrival to home, patient was found on the floor, and patient states that he has a history of vertigo and that vertigo made him fall 2 days ago.  Pt refused to come to hospital at first, per EMS report.  On ED arrival, pt alert and oriented but refusing to give some information.  Pt intermittently cooperative.  EMS stated that pt lives alone and that his home was "unfit to live in" and that DSS and [place] [employer] was notified by Ivan Anchors.  Pt states he has pain in left arm but does not give descriptors, etc.

## 2015-09-23 NOTE — ED Notes (Signed)
Patient transported to X-ray 

## 2015-09-23 NOTE — ED Provider Notes (Signed)
-----------------------------------------   4:51 PM on 09/23/2015 -----------------------------------------  The patient is awake, he does unsure of the time or the day but he does his name and where he is. Patient has a large hematoma to the occiput but no evidence of fracture. He states he may have fallen but he can't remember. Given his overall weakness, we are admitting the hospital. Patient does have a left-sided shoulder pain which is chronic and has been imaged before likely has metastatic disease there. We'll reimage to make sure that there is no pathologic fracture. I've admitted to the hospital for further evaluation.  Schuyler Amor, MD 09/23/15 [phone]
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
## 2015-09-23 NOTE — ED Provider Notes (Addendum)
-----------------------------------------   6:13 PM on 09/23/2015 -----------------------------------------  Patient very concerning to me, he apparently lives by himself has no close family nearby attempted to call on his next of kin results and the futility, there is no answer to the phone number given and there is no voicemail. The patient adamantly refuses admission to the hospital despite the fact that apparently is unable to walk at home, has clearly hit his head, and is living in squalor with an inability to get to the door to get food. Clearly has limited insight into his condition. We discussed with the hospitalist service and given the patient's refusal for admission, we cannot at this time compelled him to stay although I'm very reluctant to try to send him home given the condition in which he was found. The patient cannot tell me why he would not stay in the hospital he has very poor insight into his reasoning or at least will not explain to me he just states "I ain't staying". There is clearly some issue with the patient's safety. I have after discussion with the hospitalist service discussed with the psychiatry service to see if they can evaluate the patient for competence and we will reassess.   ----------------------------------------- 8:45 PM on 09/23/2015 -----------------------------------------  Was seen and evaluated by psychiatry, appreciate the consult. They feel the patient does understand the possible ramifications of going home including death. That being said, I certainly cannot safely send the patient home to possibly die on his floor without talking to dull protective services and social work, we will watch the patient here in the emergency room as he refuses admission and we will have social worker evaluate him in the morning.  Schuyler Amor, MD 09/23/15 [place], MD 09/23/15 2046

## 2015-09-24 NOTE — ED Notes (Signed)
Awake and alert.  Patient's friend Mechele Claude at bedside to drive patient home and let him into his home.  Patient placed in paper scrubs and d/c home via wheelchair.

## 2015-09-24 NOTE — ED Notes (Signed)
Pt sleeping no distress noted, will continue to monitor. Pt waiting for SW consult and PSY consult in the morning

## 2015-09-24 NOTE — Progress Notes (Signed)
Consent was not given by patient. During interview patient did give this SW some information. He does receive monthly check, food daily, he reports he has running water, heat and electricity. He reports he came to ED because he had a fall and couldn't get up and his shoulder hurts. He wants to return home. LCSW offered to call his friend or family for pick up and he REFUSED- DO NOT CALL.  LCSW called APS [phone]-  Then informedTina Ayesha Hayes is his designated Education officer, museum and she will be informed patient will be discharged home [place] LCSW [phone]

## 2015-09-24 NOTE — ED Notes (Signed)
Spoke with Kailon Treese, patient's cousin [phone]).  States he does not have a key to patient's home, but that Dierdre Searles does have a key.  Joanne's phone number is [phone].  Mr. Marietta will call Mechele Claude and have her call the ED to arrange/ coordinate patient's discharge home.

## 2015-09-24 NOTE — ED Notes (Signed)
Upon entering pt's room, pt is resting with eyes closed, able to follow commands, denies any pain or discomfort at present.  RN asked pt if he felt wet denies, RN check no incontinence of urine or stool noted. Pt awaiting Social worker consult in the morning, Psy consult

## 2015-09-24 NOTE — Care Management Note (Signed)
Case Management Note  Patient Details  Name: Vernon Hayes MRN: [id number] Date of Birth: 09-30-1945  Subjective/Objective:    The phone number provided to staff [phone]  Is acutally for a young lady named Aslyn, a contract PT person from [place] who worked with the pt. 3 years ago. She has no knowledge that can help, as she is no longer working with them or the pt.                Action/Plan:   Expected Discharge Date:                  Expected Discharge Plan:     In-House Referral:     Discharge planning Services     Post Acute Care Choice:    Choice offered to:     DME Arranged:    DME Agency:     HH Arranged:    [place] Agency:     Status of Service:     Medicare Important Message Given:    Date Medicare IM Given:    Medicare IM give by:    Date Additional Medicare IM Given:    Additional Medicare Important Message give by:     If discussed at [employer] of Stay Meetings, dates discussed:    Additional Comments:  Beau Fanny, RN 09/24/2015, 11:42 AM
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
## 2015-09-24 NOTE — ED Notes (Signed)
Pt sleeping no distress noted, will continue to monitor
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
## 2015-09-24 NOTE — Care Management Note (Signed)
Case Management Note  Patient Details  Name: Vernon Hayes MRN: [id number] Date of Birth: 1946/01/01  Subjective/Objective:     After speaking to the CSW pt. Has denied [place] the chance to set him up for servcies. He just wants to go home. It turns out this is a challenge because on further exam, the patient does not have a key to his house, or the name/ number of anyone else who does that we can contact. We are trying to access Vernon Hayes to see if she has any knowledge that might help [place].               Action/Plan:   Expected Discharge Date:                  Expected Discharge Plan:     In-House Referral:     Discharge planning Services     Post Acute Care Choice:    Choice offered to:     DME Arranged:    DME Agency:     HH Arranged:    [place] Agency:     Status of Service:     Medicare Important Message Given:    Date Medicare IM Given:    Medicare IM give by:    Date Additional Medicare IM Given:    Additional Medicare Important Message give by:     If discussed at [place] of Stay Meetings, dates discussed:    Additional Comments:  Beau Fanny, RN 09/24/2015, 10:55 AM

## 2015-09-24 NOTE — ED Notes (Signed)
Received report from [place], [place] assumed at this time
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
## 2015-09-24 NOTE — ED Notes (Signed)
This Probation officer attempted to contact pt emergency contact person, Philmore Lepore via telephone. There was no answer at the number listed and the voicemail box is full and a message cannot be left.

## 2015-09-24 NOTE — ED Provider Notes (Signed)
Patient has been cleared by psychiatry. Patient is or does not meet criteria for admission. Patient still wants to go home.  Adult Protective Services has been involved and will watch the patient at home. In addition we will put in consults for physical therapy and home health nursing to keep a very close eye on this patient at home. Hopefully this will enable the patient to avoid any further untoward events.  Nena Polio, MD 09/24/15 [phone]

## 2015-09-24 NOTE — ED Notes (Signed)
called and left a detailed message for Ms Vernon Hayes [phone] Awaiting call back

## 2015-09-24 NOTE — ED Notes (Addendum)
Encouraged pt to ambulate in room. Pt assisted out of bed to stand. Pt ambulated slowly but steadily in room with assist. Pt incontinent of stool. Pt cleaned and new brief applied. Pt returned to bed.

## 2015-09-24 NOTE — Progress Notes (Signed)
LCSW met with patient and ED nurse. Earlier today Mr Weakland did not give consent to contact Mr Gram Siedlecki or Dierdre Searles. In brief meeting patient is now agreeable to either reach his cousin or his friend Ms Dierdre Searles. LCSW let the charge nurse and ED nurse aware consent has NOW been given by patient and  We will all be able to make phone calls as well.  LCSW called and left a detailed message for Ms Dierdre Searles  [phone] Awaiting call back  [place] LCSW [phone]

## 2015-09-24 NOTE — ED Notes (Signed)
Called Alter Moss to follow up with him r/t no response from Dierdre Searles.  Mrl Brandl was able to call Ms. Grandville Silos and sates that Ms. Grandville Silos will be on her was way to pick patient up.

## 2015-09-25 ENCOUNTER — Inpatient Hospital Stay
Admission: EM | Admit: 2015-09-25 | Discharge: 2015-09-28 | DRG: 640 | Disposition: A | Discharge status: Skilled Nursing Facility | Payer: Medicare Other | Attending: Internal Medicine | Admitting: Internal Medicine

## 2015-09-25 ENCOUNTER — Emergency Department: Payer: Medicare Other

## 2015-09-25 DIAGNOSIS — K219 Gastro-esophageal reflux disease without esophagitis: Secondary | ICD-10-CM | POA: Diagnosis present

## 2015-09-25 DIAGNOSIS — R296 Repeated falls: Secondary | ICD-10-CM | POA: Diagnosis present

## 2015-09-25 DIAGNOSIS — Z79891 Long term (current) use of opiate analgesic: Secondary | ICD-10-CM | POA: Diagnosis not present

## 2015-09-25 DIAGNOSIS — Z808 Family history of malignant neoplasm of other organs or systems: Secondary | ICD-10-CM

## 2015-09-25 DIAGNOSIS — F329 Major depressive disorder, single episode, unspecified: Secondary | ICD-10-CM | POA: Diagnosis present

## 2015-09-25 DIAGNOSIS — Z85810 Personal history of malignant neoplasm of tongue: Secondary | ICD-10-CM

## 2015-09-25 DIAGNOSIS — F17210 Nicotine dependence, cigarettes, uncomplicated: Secondary | ICD-10-CM | POA: Diagnosis present

## 2015-09-25 DIAGNOSIS — I10 Essential (primary) hypertension: Secondary | ICD-10-CM | POA: Diagnosis present

## 2015-09-25 DIAGNOSIS — Z85118 Personal history of other malignant neoplasm of bronchus and lung: Secondary | ICD-10-CM | POA: Diagnosis not present

## 2015-09-25 DIAGNOSIS — R531 Weakness: Secondary | ICD-10-CM | POA: Diagnosis present

## 2015-09-25 DIAGNOSIS — Z801 Family history of malignant neoplasm of trachea, bronchus and lung: Secondary | ICD-10-CM | POA: Diagnosis not present

## 2015-09-25 DIAGNOSIS — M25512 Pain in left shoulder: Secondary | ICD-10-CM | POA: Diagnosis present

## 2015-09-25 DIAGNOSIS — Z79899 Other long term (current) drug therapy: Secondary | ICD-10-CM

## 2015-09-25 DIAGNOSIS — E43 Unspecified severe protein-calorie malnutrition: Secondary | ICD-10-CM | POA: Diagnosis present

## 2015-09-25 DIAGNOSIS — F0390 Unspecified dementia without behavioral disturbance: Secondary | ICD-10-CM | POA: Diagnosis present

## 2015-09-25 DIAGNOSIS — E860 Dehydration: Secondary | ICD-10-CM | POA: Diagnosis present

## 2015-09-25 DIAGNOSIS — Z823 Family history of stroke: Secondary | ICD-10-CM

## 2015-09-25 LAB — CBC WITH DIFFERENTIAL/PLATELET
Basophils Absolute: 0 10*3/uL (ref 0–0.1)
Basophils Relative: 1 %
Eosinophils Absolute: 0.1 10*3/uL (ref 0–0.7)
Eosinophils Relative: 1 %
HEMATOCRIT: 40.2 % (ref 40.0–52.0)
HEMOGLOBIN: 13.6 g/dL (ref 13.0–18.0)
LYMPHS ABS: 0.7 10*3/uL — AB (ref 1.0–3.6)
LYMPHS PCT: 10 %
MCH: 36 pg — AB (ref 26.0–34.0)
MCHC: 33.9 g/dL (ref 32.0–36.0)
MCV: 106.4 fL — AB (ref 80.0–100.0)
Monocytes Absolute: 0.7 10*3/uL (ref 0.2–1.0)
Monocytes Relative: 12 %
NEUTROS ABS: 5 10*3/uL (ref 1.4–6.5)
NEUTROS PCT: 76 %
Platelets: 194 10*3/uL (ref 150–440)
RBC: 3.78 MIL/uL — AB (ref 4.40–5.90)
RDW: 14 % (ref 11.5–14.5)
WBC: 6.5 10*3/uL (ref 3.8–10.6)

## 2015-09-25 LAB — COMPREHENSIVE METABOLIC PANEL
ALBUMIN: 3.8 g/dL (ref 3.5–5.0)
ALK PHOS: 48 U/L (ref 38–126)
ALT: 10 U/L — AB (ref 17–63)
AST: 15 U/L (ref 15–41)
Anion gap: 7 (ref 5–15)
BUN: 23 mg/dL — AB (ref 6–20)
CALCIUM: 12.3 mg/dL — AB (ref 8.9–10.3)
CHLORIDE: 104 mmol/L (ref 101–111)
CO2: 31 mmol/L (ref 22–32)
CREATININE: 1.51 mg/dL — AB (ref 0.61–1.24)
GFR calc Af Amer: 52 mL/min — ABNORMAL LOW (ref >60)
GFR calc non Af Amer: 45 mL/min — ABNORMAL LOW (ref >60)
GLUCOSE: 109 mg/dL — AB (ref 65–99)
Potassium: 3.3 mmol/L — ABNORMAL LOW (ref 3.5–5.1)
SODIUM: 142 mmol/L (ref 135–145)
Total Bilirubin: 0.7 mg/dL (ref 0.3–1.2)
Total Protein: 7.5 g/dL (ref 6.5–8.1)

## 2015-09-25 LAB — CK: CK TOTAL: 30 U/L — AB (ref 49–397)

## 2015-09-25 LAB — MAGNESIUM: MAGNESIUM: 1.5 mg/dL — AB (ref 1.7–2.4)

## 2015-09-25 LAB — TSH: TSH: 1.429 u[IU]/mL (ref 0.350–4.500)

## 2015-09-25 LAB — LIPASE, BLOOD: Lipase: 33 U/L (ref 11–51)

## 2015-09-25 LAB — TROPONIN I: Troponin I: <0.03 ng/mL (ref <0.031)

## 2015-09-25 LAB — LACTIC ACID, PLASMA
Lactic Acid, Venous: 1.5 mmol/L (ref 0.5–2.0)
Lactic Acid, Venous: 1.3 mmol/L (ref 0.5–2.0)

## 2015-09-25 LAB — BRAIN NATRIURETIC PEPTIDE: B NATRIURETIC PEPTIDE 5: 45 pg/mL (ref 0.0–100.0)

## 2015-09-25 MED ORDER — HYDROCODONE-ACETAMINOPHEN 5-325 MG PO TABS
1.0000 | ORAL_TABLET | ORAL | Status: DC | PRN
  Administered 2015-09-27: 1 via ORAL
  Filled 2015-09-25: qty 1

## 2015-09-25 MED ORDER — ACETAMINOPHEN 325 MG PO TABS: 650.0000 mg | ORAL_TABLET | Freq: Four times a day (QID) | ORAL | Status: DC | PRN

## 2015-09-25 MED ORDER — POTASSIUM CHLORIDE IN NACL 20-0.9 MEQ/L-% IV SOLN
INTRAVENOUS | Status: DC
  Administered 2015-09-25 – 2015-09-27 (×5): via INTRAVENOUS
  Filled 2015-09-25 (×10): qty 1000

## 2015-09-25 MED ORDER — ALBUTEROL SULFATE (2.5 MG/3ML) 0.083% IN NEBU: 2.5000 mg | INHALATION_SOLUTION | RESPIRATORY_TRACT | Status: DC | PRN

## 2015-09-25 MED ORDER — POLYETHYLENE GLYCOL 3350 17 G PO PACK: 17.0000 g | PACK | Freq: Every day | ORAL | Status: DC | PRN

## 2015-09-25 MED ORDER — ONDANSETRON HCL 4 MG PO TABS: 4.0000 mg | ORAL_TABLET | Freq: Four times a day (QID) | ORAL | Status: DC | PRN

## 2015-09-25 MED ORDER — DOCUSATE SODIUM 100 MG PO CAPS
100.0000 mg | ORAL_CAPSULE | Freq: Two times a day (BID) | ORAL | Status: DC
  Administered 2015-09-25 – 2015-09-28 (×5): 100 mg via ORAL
  Filled 2015-09-25 (×5): qty 1

## 2015-09-25 MED ORDER — ENOXAPARIN SODIUM 40 MG/0.4ML ~~LOC~~ SOLN
40.0000 mg | SUBCUTANEOUS | Status: DC
  Administered 2015-09-25 – 2015-09-27 (×3): 40 mg via SUBCUTANEOUS
  Filled 2015-09-25 (×3): qty 0.4

## 2015-09-25 MED ORDER — ASPIRIN EC 81 MG PO TBEC
81.0000 mg | DELAYED_RELEASE_TABLET | Freq: Every day | ORAL | Status: DC
  Administered 2015-09-25 – 2015-09-28 (×4): 81 mg via ORAL
  Filled 2015-09-25 (×4): qty 1

## 2015-09-25 MED ORDER — POTASSIUM CHLORIDE CRYS ER 20 MEQ PO TBCR
40.0000 meq | EXTENDED_RELEASE_TABLET | ORAL | Status: AC
  Administered 2015-09-25 (×2): 40 meq via ORAL
  Filled 2015-09-25 (×2): qty 2

## 2015-09-25 MED ORDER — ONDANSETRON HCL 4 MG/2ML IJ SOLN: 4.0000 mg | Freq: Four times a day (QID) | INTRAMUSCULAR | Status: DC | PRN

## 2015-09-25 MED ORDER — SODIUM CHLORIDE 0.9 % IV BOLUS (SEPSIS)
1000.0000 mL | Freq: Once | INTRAVENOUS | Status: AC
  Administered 2015-09-25: 1000 mL via INTRAVENOUS

## 2015-09-25 MED ORDER — ACETAMINOPHEN 650 MG RE SUPP: 650.0000 mg | Freq: Four times a day (QID) | RECTAL | Status: DC | PRN

## 2015-09-25 MED ORDER — MECLIZINE HCL 25 MG PO TABS
25.0000 mg | ORAL_TABLET | Freq: Three times a day (TID) | ORAL | Status: DC | PRN
  Filled 2015-09-25: qty 1

## 2015-09-25 MED ORDER — BISACODYL 10 MG RE SUPP: 10.0000 mg | Freq: Every day | RECTAL | Status: DC | PRN

## 2015-09-25 MED ORDER — GABAPENTIN 300 MG PO CAPS
300.0000 mg | ORAL_CAPSULE | Freq: Two times a day (BID) | ORAL | Status: DC
  Administered 2015-09-25 – 2015-09-28 (×7): 300 mg via ORAL
  Filled 2015-09-25 (×7): qty 1

## 2015-09-25 MED ORDER — SODIUM CHLORIDE 0.9% FLUSH
3.0000 mL | Freq: Two times a day (BID) | INTRAVENOUS | Status: DC
  Administered 2015-09-26 – 2015-09-28 (×5): 3 mL via INTRAVENOUS
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
## 2015-09-25 NOTE — ED Notes (Signed)
Pt cleaned and changed  

## 2015-09-25 NOTE — ED Notes (Signed)
Pt came to ED c/o weakness and left shoulder pain. Pt seen at Ed 2 days ago and diagnosed with closed head injury. Refused admission and was taken home.
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
## 2015-09-26 LAB — COMPREHENSIVE METABOLIC PANEL
ALT: 9 U/L — ABNORMAL LOW (ref 17–63)
ANION GAP: 2 — AB (ref 5–15)
AST: 13 U/L — ABNORMAL LOW (ref 15–41)
Albumin: 2.9 g/dL — ABNORMAL LOW (ref 3.5–5.0)
Alkaline Phosphatase: 42 U/L (ref 38–126)
BILIRUBIN TOTAL: 0.3 mg/dL (ref 0.3–1.2)
BUN: 19 mg/dL (ref 6–20)
CHLORIDE: 114 mmol/L — AB (ref 101–111)
CO2: 26 mmol/L (ref 22–32)
Calcium: 10.6 mg/dL — ABNORMAL HIGH (ref 8.9–10.3)
Creatinine, Ser: 1.11 mg/dL (ref 0.61–1.24)
Glucose, Bld: 92 mg/dL (ref 65–99)
POTASSIUM: 4.4 mmol/L (ref 3.5–5.1)
Sodium: 142 mmol/L (ref 135–145)
TOTAL PROTEIN: 6 g/dL — AB (ref 6.5–8.1)

## 2015-09-26 LAB — CBC
HEMATOCRIT: 34.4 % — AB (ref 40.0–52.0)
Hemoglobin: 11.5 g/dL — ABNORMAL LOW (ref 13.0–18.0)
MCH: 35.6 pg — ABNORMAL HIGH (ref 26.0–34.0)
MCHC: 33.4 g/dL (ref 32.0–36.0)
MCV: 106.6 fL — AB (ref 80.0–100.0)
PLATELETS: 169 10*3/uL (ref 150–440)
RBC: 3.23 MIL/uL — AB (ref 4.40–5.90)
RDW: 14.2 % (ref 11.5–14.5)
WBC: 4.6 10*3/uL (ref 3.8–10.6)

## 2015-09-26 MED ORDER — MAGNESIUM OXIDE 400 (241.3 MG) MG PO TABS
400.0000 mg | ORAL_TABLET | Freq: Two times a day (BID) | ORAL | Status: DC
  Administered 2015-09-26 – 2015-09-28 (×5): 400 mg via ORAL
  Filled 2015-09-26 (×5): qty 1

## 2015-09-26 NOTE — Progress Notes (Signed)
LCSW attempted to meet with patient today to complete his assessment. I was asked to leave as I just spoiled his appetite. The patient requested we all LEAVE him alone.  LCSW completed assessment based on information I collected on May 19th/2017. LCSW respected patients wish to be left alone.  Doyce Stonehouse LCSW

## 2015-09-26 NOTE — Evaluation (Signed)
Physical Therapy Evaluation Patient Details Name: Vernon Hayes MRN: [id number] DOB: 04-Nov-1945 Today's Date: 09/26/2015   History of Present Illness  Pt is a 70 y.o. male presenting with weakness and L shoulder pain and admitted with hypercalcemia.  Pt with recent ED visit but refused admission.  Recent CT of head showed broad based scalp hematoma.  L shoulder x-ray shows elevated bone destruction L AC joint involving both distal clavicle and acromion that's progressive since 09/02/15 (pt also with questionable additional lucent lesion L humeral head) but negative for acute fx.  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  Clinical Impression  Prior to admission, pt was independent ambulating with SPC.  Pt lives alone in 1 level apt with no stairs to enter per pt report.  Pt requiring encouragement to participate and requiring extra time and redirection for safety during session (pt demonstrates impaired safety awareness).  Currently pt is min assist supine to sit, min to mod assist with transfers, and min assist to ambulate short distances with SPC in room.  Pt would benefit from skilled PT to address noted impairments and functional limitations.  D/t pt's balance impairments and decreased safety awareness, pt does not appear safe to discharge home in current living situation.  Recommend pt discharge to STR when medically appropriate.     Follow Up Recommendations SNF    Equipment Recommendations       Recommendations for Other Services       Precautions / Restrictions Precautions Precautions: Fall Precaution Comments: R chest port; metastatic bone involvement Restrictions Weight Bearing Restrictions: No      Mobility  Bed Mobility Overal bed mobility: Needs Assistance Bed Mobility: Supine to Sit     Supine to sit: Min assist;HOB elevated     General bed mobility comments:  assist for trunk; increased time to perform  Transfers Overall transfer level: Needs assistance Equipment used: Straight cane Transfers: Sit to/from Stand;Stand Pivot Transfers Sit to Stand: Min assist;Mod assist Stand pivot transfers: Min assist;Mod assist (transfer to commode in toilet; mod vc's required to line up to toilet safely)       General transfer comment: vc's required for safety and positioning; increased time to perform  Ambulation/Gait Ambulation/Gait assistance: Min assist Ambulation Distance (Feet):  (25 feet; 20 feet) Assistive device: Straight cane Gait Pattern/deviations: Step-to pattern;Decreased step length - right;Decreased step length - left;Decreased stride length Gait velocity: significantly decreased cadence   General Gait Details: pt holding onto objects in room with R UE (refused to use PT suggested support for UE's) and cane in L UE; unsteady  Stairs            Wheelchair Mobility    Modified Rankin (Stroke Patients Only)       Balance Overall balance assessment: Needs assistance;History of Falls Sitting-balance support: Single extremity supported;Feet supported Sitting balance-Leahy Scale: Good     Standing balance support: Single extremity supported;During functional activity Standing balance-Leahy Scale: Poor Standing balance comment: unsteady ambulating with SPC                             Pertinent Vitals/Pain Pain Assessment: Faces Faces Pain Scale: Hurts even more Pain Location: L shoulder with movement Pain Descriptors / Indicators: Sore Pain Intervention(s): Limited activity within patient's tolerance;Monitored during session;Repositioned  Vitals stable and WFL throughout treatment session.    Home  Living Family/patient expects to be discharged to:: Private residence Living Arrangements: Alone   Type of Home: Apartment Home Access: Level entry     Home Layout: One level        Prior Function Level of  Independence: Independent with assistive device(s)         Comments: Pt using SPC in R UE.  Per chart pt receives assist from cousins and has MOW.     Hand Dominance        Extremity/Trunk Assessment   Upper Extremity Assessment: RUE deficits/detail;LUE deficits/detail (limited per pt's cooperation with eval) RUE Deficits / Details: R UE strength appears WFL     LUE Deficits / Details: pt guarded L UE and declined assessment   Lower Extremity Assessment: Generalized weakness (limited assessment per pt's cooperation with eval)         Communication   Communication: No difficulties  Cognition Arousal/Alertness: Awake/alert Behavior During Therapy: Impulsive (Intermittently mildly agitated when wanting to do things a certain way) Overall Cognitive Status: Difficult to assess                      General Comments   Nursing cleared pt for participation in physical therapy.  Pt agreeable to PT session with some encouragement.    Exercises  Transfer and gait training.      Assessment/Plan    PT Assessment Patient needs continued PT services  PT Diagnosis Difficulty walking;Generalized weakness   PT Problem List Decreased strength;Decreased activity tolerance;Decreased balance;Decreased mobility;Decreased knowledge of use of DME;Decreased knowledge of precautions;Decreased safety awareness;Pain  PT Treatment Interventions DME instruction;Gait training;Functional mobility training;Therapeutic activities;Therapeutic exercise;Balance training;Patient/family education   PT Goals (Current goals can be found in the Care Plan section) Acute Rehab PT Goals Patient Stated Goal: to go home PT Goal Formulation: With patient Time For Goal Achievement: 10/10/15 Potential to Achieve Goals: Fair    Frequency Min 2X/week   Barriers to discharge Decreased caregiver support      Co-evaluation               End of Session Equipment Utilized During Treatment: Gait  belt Activity Tolerance: Patient tolerated treatment well Patient left: in chair;with call bell/phone within reach;with chair alarm set Nurse Communication: Mobility status;Precautions         Time: 1137-1220 PT Time Calculation (min) (ACUTE ONLY): 43 min   Charges:   PT Evaluation $PT Eval Moderate Complexity: 1 Procedure PT Treatments $Gait Training: 8-22 mins $Therapeutic Activity: 8-22 mins   PT G CodesLeitha Bleak 10/21/2015, 1:46 PM Leitha Bleak, [place]

## 2015-09-26 NOTE — NC FL2 (Signed)
[place] LEVEL OF CARE SCREENING TOOL     IDENTIFICATION  Patient Name: Vernon Hayes Birthdate: 07/24/1945 Sex: male Admission Date (Current Location): 09/25/2015  [place] and [place] Number:  Engineering geologist and Address:  [facility], [address] [place], [place] [address]      Provider Number: [id number]  Attending Physician Name and Address:  Dustin Flock, MD  Relative Name and Phone Number:       Current Level of Care: Hospital Recommended Level of Care: [place] Prior Approval Number:    Date Approved/Denied:   PASRR Number:  [id number]  Discharge Plan: SNF    Current Diagnoses: Patient Active Problem List   Diagnosis Date Noted  . Hypercalcemia 09/25/2015  . Vertigo 09/23/2015  . 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 Malnutrition of moderate degree ([place]) 01/22/2015  . Lung mass 01/19/2015  . Pre-operative cardiovascular examination 01/12/2015  . Essential hypertension 01/12/2015  . Abnormal PET scan of lung     Orientation RESPIRATION BLADDER Height & Weight     Self  Normal Incontinent Weight: 140 lb 9.6 oz (63.776 kg) Height:  6' (182.9 cm)  BEHAVIORAL SYMPTOMS/MOOD NEUROLOGICAL BOWEL NUTRITION STATUS      Incontinent Diet (Normal)  AMBULATORY STATUS COMMUNICATION OF NEEDS Skin   Limited Assist Verbally (Poor historian) Normal                       Personal Care Assistance Level of Assistance  Bathing, Feeding, Dressing, Total care Bathing Assistance: Maximum assistance Feeding assistance: Maximum assistance Dressing Assistance: Maximum assistance Total Care Assistance: Maximum assistance   Functional Limitations Info  Sight, Hearing, Speech Sight Info: Impaired Hearing Info: Impaired Speech Info:  (Prior tongue cancer)    SPECIAL CARE FACTORS FREQUENCY                       Contractures      Additional Factors Info  Code Status Code Status Info: Full              Current Medications (09/26/2015):  This is the current hospital active medication list Current Facility-Administered Medications  Medication Dose Route Frequency Provider Last Rate Last Dose  . 0.9 % NaCl with KCl 20 mEq/ L  infusion   Intravenous Continuous Hillary Bow, MD 100 mL/hr at 09/26/15 0705    . acetaminophen (TYLENOL) tablet 650 mg  650 mg Oral Q6H PRN Hillary Bow, MD       Or  . acetaminophen (TYLENOL) suppository 650 mg  650 mg Rectal Q6H PRN Srikar Sudini, MD      . albuterol (PROVENTIL) (2.5 MG/3ML) 0.083% nebulizer solution 2.5 mg  2.5 mg Nebulization Q2H PRN Srikar Sudini, MD      . aspirin EC tablet 81 mg  81 mg Oral Daily Hillary Bow, MD   81 mg at 09/25/15 1618  . bisacodyl (DULCOLAX) suppository 10 mg  10 mg Rectal Daily PRN Srikar Sudini, MD      . docusate sodium (COLACE) capsule 100 mg  100 mg Oral BID Hillary Bow, MD   100 mg at 09/25/15 2100  . enoxaparin (LOVENOX) injection 40 mg  40 mg Subcutaneous Q24H Hillary Bow, MD   40 mg at 09/25/15 2100  . gabapentin (NEURONTIN) capsule 300 mg  300 mg Oral BID Hillary Bow, MD   300 mg at 09/25/15 2100  . HYDROcodone-acetaminophen (NORCO/VICODIN) 5-325 MG per tablet 1-2 tablet  1-2  tablet Oral Q4H PRN Hillary Bow, MD      . meclizine (ANTIVERT) tablet 25 mg  25 mg Oral TID PRN Hillary Bow, MD      . ondansetron (ZOFRAN) tablet 4 mg  4 mg Oral Q6H PRN Hillary Bow, MD       Or  . ondansetron (ZOFRAN) injection 4 mg  4 mg Intravenous Q6H PRN Srikar Sudini, MD      . polyethylene glycol (MIRALAX / GLYCOLAX) packet 17 g  17 g Oral Daily PRN Srikar Sudini, MD      . sodium chloride flush (NS) 0.9 % injection 3 mL  3 mL Intravenous Q12H Hillary Bow, MD   3 mL at 09/25/15 1619     Discharge Medications: Please see discharge summary for a list of discharge medications.  Relevant Imaging Results:  Relevant Lab Results:   Additional Information  SSN [id number]  Joana Reamer, [place]

## 2015-09-26 NOTE — Progress Notes (Signed)
[name] at [facility]                                                                                                                                                                                            Patient Demographics   Vernon Hayes, is a 70 y.o. male, DOB - October 23, 1945, NID:[id number]  Admit date - 09/25/2015   Admitting Physician Hillary Bow, MD  Outpatient Primary MD for the patient is MASOUD,JAVED, MD   LOS - 1  Subjective:Patient admitted with weakness and dehydration he is very weak. Feeling little better     Review of Systems:   CONSTITUTIONAL: No documented fever.Positive fatigue, positive weakness. No weight gain, no weight loss.  EYES: No blurry or double vision.  ENT: No tinnitus. No postnasal drip. No redness of the oropharynx.  RESPIRATORY: No cough, no wheeze, no hemoptysis. No dyspnea.  CARDIOVASCULAR: No chest pain. No orthopnea. No palpitations. No syncope.  GASTROINTESTINAL: No nausea, no vomiting or diarrhea. No abdominal pain. No melena or hematochezia.  GENITOURINARY: No dysuria or hematuria.  ENDOCRINE: No polyuria or nocturia. No heat or cold intolerance.  HEMATOLOGY: No anemia. No bruising. No bleeding.  INTEGUMENTARY: No rashes. No lesions.  MUSCULOSKELETAL: No arthritis. No swelling. No gout.  NEUROLOGIC: No numbness, tingling, or ataxia. No seizure-type activity.  PSYCHIATRIC: No anxiety. No insomnia. No ADD.    Vitals:   Filed Vitals:   09/25/15 1655 09/25/15 1658 09/25/15 2233 09/26/15 0453  BP: 132/91  131/88 136/89  Pulse:   73 63  Temp:   97.4 F (36.3 C)   TempSrc:   Oral   Resp:   18 18  Height:  6' (1.829 m)    Weight:  63.776 kg (140 lb 9.6 oz)    SpO2:   98% 98%    Wt Readings from Last 3 Encounters:  09/25/15 63.776 kg (140 lb 9.6 oz)  09/23/15 63.504 kg (140 lb)  06/28/15 68.9 kg (151 lb 14.4 oz)     Intake/Output Summary (Last 24 hours) at 09/26/15 1414 Last data filed  at 09/26/15 0900  Gross per 24 hour  Intake 264.67 ml  Output      0 ml  Net 264.67 ml    Physical Exam:   GENERAL: Pleasant-appearing Weak HEAD, EYES, EARS, NOSE AND THROAT: Atraumatic, normocephalic. Extraocular muscles are intact. Pupils equal and reactive to light. Sclerae anicteric. No conjunctival injection. No oro-pharyngeal erythema.  NECK: Supple. There is no jugular venous distention. No bruits, no lymphadenopathy, no thyromegaly.  HEART: Regular rate and rhythm,. No murmurs, no rubs, no clicks.  LUNGS: Clear  to auscultation bilaterally. No rales or rhonchi. No wheezes.  ABDOMEN: Soft, flat, nontender, nondistended. Has good bowel sounds. No hepatosplenomegaly appreciated.  EXTREMITIES: No evidence of any cyanosis, clubbing, or peripheral edema.  +2 pedal and radial pulses bilaterally.  NEUROLOGIC: The patient is alert, awake, and oriented x3 with no focal motor or sensory deficits appreciated bilaterally.  SKIN: Moist and warm with no rashes appreciated.  Psych: Not anxious, depressed LN: No inguinal LN enlargement    Antibiotics   Anti-infectives    None      Medications   Scheduled Meds: . aspirin EC  81 mg Oral Daily  . docusate sodium  100 mg Oral BID  . enoxaparin (LOVENOX) injection  40 mg Subcutaneous Q24H  . gabapentin  300 mg Oral BID  . magnesium oxide  400 mg Oral BID  . sodium chloride flush  3 mL Intravenous Q12H   Continuous Infusions: . 0.9 % NaCl with KCl 20 mEq / L 100 mL/hr at 09/26/15 1053   PRN Meds:.acetaminophen **OR** acetaminophen, albuterol, bisacodyl, HYDROcodone-acetaminophen, meclizine, ondansetron **OR** ondansetron (ZOFRAN) IV, polyethylene glycol   Data Review:   Micro Results No results found for this or any previous visit (from the past 240 hour(s)).  Radiology Reports Ct Head Wo Contrast  09/23/2015  CLINICAL DATA:  70 year old male with dizziness. Right lung cancer status post treatment. Initial encounter. EXAM: CT HEAD  WITHOUT CONTRAST TECHNIQUE: Contiguous axial images were obtained from the base of the skull through the vertex without intravenous contrast. COMPARISON:  Head CT 08/20/2015 and earlier. FINDINGS: Mild paranasal sinus mucosal thickening is new. Stable tympanic cavities and mastoids, chronic left mastoid sclerosis is unchanged. No acute osseous abnormality identified. There is a new broad-based posterior and superior scalp hematoma measuring up to 10 mm in thickness. No acute or suspicious calvarium finding identified. Calcified atherosclerosis at the skull base. Visualized orbit soft tissues are within normal limits. Stable cerebral volume. No ventriculomegaly. 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 No cortically based acute infarct identified. No acute intracranial hemorrhage identified. No midline shift, mass effect, or evidence of intracranial mass lesion. IMPRESSION: 1. Broad-based scalp hematoma, up to 10 mm in thickness. No underlying acute osseous abnormality. 2. Stable non contrast CT appearance of the brain. Moderately advanced chronic small vessel ischemia. 3. Note that early metastatic disease to the brain cannot be excluded in the absence of intravenous contrast. Electronically Signed   By: Genevie Ann M.D.   On: 09/23/2015 13:09   Dg Chest Portable 1 View  09/25/2015  CLINICAL DATA:  70 year old male with a history of weakness and left shoulder pain EXAM: PORTABLE CHEST 1 VIEW COMPARISON:  06/25/2015 FINDINGS: Cardiomediastinal silhouette unchanged in size and contour. Tortuosity of descending thoracic aorta. Calcifications of the aortic arch. Right upper lung nodule/mass, with surgical changes, better characterized on recent CT. Unchanged position of right IJ approach port catheter. Degenerative changes of bilateral shoulders.  IMPRESSION: Chronic lung changes without evidence of acute cardiopulmonary disease. Right upper lobe surgical changes/nodularity, better characterized on prior chest CT. Unchanged right IJ port catheter. Degenerative changes bilateral shoulders. Signed, Dulcy Fanny. Earleen Newport, DO [facility] Electronically Signed   By: Corrie Mckusick D.O.   On: 09/25/2015 11:23   Dg Shoulder Left  09/25/2015  CLINICAL DATA:  Pain. EXAM: LEFT SHOULDER - 2+ VIEW COMPARISON:  Sep 23, 2015 FINDINGS: Again noted are destructive changes in the distal clavicle and AC joint, unchanged since 2 days ago. There is a rounded lucency in the humeral head as well, also stable. Irregularity along the humeral head is stable and chronic in appearance. No other acute abnormalities. IMPRESSION: No acute fracture all dislocation. Continued destructive change in the distal clavicle and AC joint as well as a rounded lucency in the humeral head. The patient has a history of lung cancer and metastatic involvement of the bones is not excluded. However, there are other potential processes, as described on the previous report, that could cause these changes. Electronically Signed   By: Dorise Bullion III M.D   On: 09/25/2015 14:25   Dg Shoulder Left  09/23/2015  CLINICAL DATA:  Recent fall, LEFT shoulder pain, poor historian, history RIGHT lung cancer post chemotherapy, radiation therapy and thoracotomy, hypertension, smoker, ethanol abuse EXAM: LEFT SHOULDER - 2+ VIEW COMPARISON:  LEFT shoulder radiographs 09/02/2015; chest radiograph 02/01/2015 FINDINGS: Osseous demineralization. Destructive changes at LEFT [facility] joint involving both the acromion and distal clavicle. Joint space is no longer identified and resorption of the distal clavicle is likely present for 2 cm length. Additional question lucent focus within the LEFT humeral head. No glenohumeral fracture or dislocation. Visualized LEFT ribs  unremarkable. IMPRESSION: Increased bone destruction at LEFT [facility] joint involving both distal clavicle and acromion progressive since 09/02/2015. Question additional lucent lesion at LEFT humeral head as well. Findings could be due to lytic metastatic disease in a patient with a history of lung cancer, though a destructive joint process at the [facility] joint such as gout or septic arthritis could cause similar bone destruction. Consider followup MR imaging to evaluate. Electronically Signed   By: Lavonia Dana M.D.   On: 09/23/2015 17:25   Dg Shoulder Left  09/02/2015  CLINICAL DATA:  Fall. Injury. Initial evaluation. Prior history of lung cancer. EXAM: LEFT SHOULDER - 2+ VIEW COMPARISON:  CT 04/23/2016 .  Chest x-ray 02/01/2015 FINDINGS: A destructive lesion(s) involving the distal clavicle and acromion appears to be present. MRI left shoulder suggested for further evaluation. No evidence of fracture or dislocation. IMPRESSION: Destructive lesion(s) noted involving the distal left clavicle and left acromion. This is new from prior study of 02/01/2015. This could represent a site of metastatic disease. Gadolinium-enhanced MRI left shoulder suggested for further evaluation. Electronically Signed   By: Marcello Moores  Register   On: 09/02/2015 10:12     CBC  Recent Labs Lab 09/23/15 1500 09/25/15 1034 09/26/15 0845  WBC 6.2 6.5 4.6  HGB 13.4 13.6 11.5*  HCT 39.3* 40.2 34.4*  PLT 183 194 169  MCV 105.1* 106.4* 106.6*  MCH 35.8* 36.0* 35.6*  MCHC 34.1 33.9 33.4  RDW 14.5 14.0 14.2  LYMPHSABS  --  0.7*  --   MONOABS  --  0.7  --   EOSABS  --  0.1  --   BASOSABS  --  0.0  --     Chemistries   Recent Labs Lab 09/23/15 1500 09/25/15 1034 09/25/15 1418 09/26/15 0845  NA 139 142  --  142  K 3.4* 3.3*  --  4.4  CL 102 104  --  114*  CO2 26 31  --  26  GLUCOSE 95 109*  --  92  BUN 23* 23*  --  19  CREATININE 1.09 1.51*  --  1.11  CALCIUM 11.5* 12.3*  --  10.6*  MG  --   --  1.5*  --  AST 17 15  --   13*  ALT 12* 10*  --  9*  ALKPHOS 49 48  --  42  BILITOT 1.7* 0.7  --  0.3   ------------------------------------------------------------------------------------------------------------------ estimated creatinine clearance is 55.1 mL/min (by C-G formula based on Cr of 1.11). ------------------------------------------------------------------------------------------------------------------ No results for input(s): HGBA1C in the last 72 hours. ------------------------------------------------------------------------------------------------------------------ No results for input(s): CHOL, HDL, LDLCALC, TRIG, CHOLHDL, LDLDIRECT in the last 72 hours. ------------------------------------------------------------------------------------------------------------------  Recent Labs  09/25/15 1418  TSH 1.429   ------------------------------------------------------------------------------------------------------------------ No results for input(s): VITAMINB12, FOLATE, FERRITIN, TIBC, IRON, RETICCTPCT in the last 72 hours.  Coagulation profile No results for input(s): INR, PROTIME in the last 168 hours.  No results for input(s): DDIMER in the last 72 hours.  Cardiac Enzymes  Recent Labs Lab 09/23/15 1500 09/25/15 1034  TROPONINI <0.03 <0.03   ------------------------------------------------------------------------------------------------------------------ Invalid input(s): POCBNP    Assessment & Plan   Patient is a 70 year old with known history of tongue and lung cancer status post resection comes in with pain and recurrent falls  *Hypercalcemia Due to severe dehydration continue IV fluids repeat calcium level in the morning   * Severe protein calorie malnutrition Admit gaze to his regimen Recently seen by Dr. Weber Cooks of psychiatry and thought to have competency.  * Hypertension Blood pressure in the normal range. Metoprolol and HCTZ on hold  * Left shoulder pain Chronic x-ray  of the shoulder without any acute abnormality   * History of tongue and lung cancer outpatient follow-up as before   * DVT prophylaxis with Lovenox     Code Status Orders        Start     Ordered   09/25/15 1327  Full code   Continuous     09/25/15 1327    Code Status History    Date Active Date Inactive Code Status Order ID Comments User Context   01/19/2015  3:47 PM 01/20/2015 11:13 AM Full Code [id number]  Nestor Lewandowsky, MD Inpatient           Consults  pt  DVT Prophylaxis  Lovenox    Lab Results  Component Value Date   PLT 169 09/26/2015     Time Spent in minutes   24mn Greater than 50% of time spent in care coordination and counseling patient regarding the condition and plan of care.   PDustin FlockM.D on 09/26/2015 at 2:14 PM  Between 7am to 6pm - Pager - [phone]  After 6pm go to [email or web address] - password [email or web address]   Office  [phone]
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
## 2015-09-26 NOTE — Plan of Care (Signed)
Problem: Fluid Volume: Goal: Ability to maintain a balanced intake and output will improve Outcome: Progressing IV fluids infusing. Oral intake encouraged.

## 2015-09-27 LAB — BASIC METABOLIC PANEL
Anion gap: 1 — ABNORMAL LOW (ref 5–15)
BUN: 14 mg/dL (ref 6–20)
CHLORIDE: 113 mmol/L — AB (ref 101–111)
CO2: 25 mmol/L (ref 22–32)
CREATININE: 1.12 mg/dL (ref 0.61–1.24)
Calcium: 10.1 mg/dL (ref 8.9–10.3)
GFR calc non Af Amer: >60 mL/min (ref >60)
Glucose, Bld: 97 mg/dL (ref 65–99)
POTASSIUM: 4.1 mmol/L (ref 3.5–5.1)
Sodium: 139 mmol/L (ref 135–145)

## 2015-09-27 LAB — MAGNESIUM: Magnesium: 1.3 mg/dL — ABNORMAL LOW (ref 1.7–2.4)

## 2015-09-27 LAB — VITAMIN D 25 HYDROXY (VIT D DEFICIENCY, FRACTURES): Vit D, 25-Hydroxy: 8.1 ng/mL — ABNORMAL LOW (ref 30.0–100.0)

## 2015-09-27 LAB — PARATHYROID HORMONE, INTACT (NO CA): PTH: 11 pg/mL — ABNORMAL LOW (ref 15–65)

## 2015-09-27 MED ORDER — ENSURE ENLIVE PO LIQD
237.0000 mL | Freq: Two times a day (BID) | ORAL | Status: DC
  Administered 2015-09-27 – 2015-09-28 (×2): 237 mL via ORAL

## 2015-09-27 MED ORDER — MAGNESIUM SULFATE 4 GM/100ML IV SOLN
4.0000 g | Freq: Once | INTRAVENOUS | Status: AC
  Administered 2015-09-27: 4 g via INTRAVENOUS
  Filled 2015-09-27: qty 100

## 2015-09-27 MED ORDER — HYDROCHLOROTHIAZIDE 25 MG PO TABS
25.0000 mg | ORAL_TABLET | Freq: Every day | ORAL | Status: DC
  Administered 2015-09-27 – 2015-09-28 (×2): 25 mg via ORAL
  Filled 2015-09-27 (×2): qty 1

## 2015-09-27 MED ORDER — METOPROLOL TARTRATE 25 MG PO TABS
25.0000 mg | ORAL_TABLET | Freq: Every day | ORAL | Status: DC
  Administered 2015-09-27 – 2015-09-28 (×2): 25 mg via ORAL
  Filled 2015-09-27 (×2): qty 1

## 2015-09-27 MED ORDER — OXYCODONE HCL 5 MG PO TABS: 5.0000 mg | ORAL_TABLET | Freq: Three times a day (TID) | ORAL | Status: DC | PRN
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
## 2015-09-27 NOTE — [place] Notes (Signed)
Initial Nutrition Assessment  DOCUMENTATION CODES:   Severe malnutrition in context of chronic illness  INTERVENTION:  -Monitor intake -Recommend Ensure Enlive po BID, each supplement provides 350 kcal and 20 grams of protein    NUTRITION DIAGNOSIS:   Malnutrition related to chronic illness as evidenced by severe depletion of body fat, severe depletion of muscle mass.    GOAL:   Patient will meet greater than or equal to 90% of their needs    MONITOR:   PO intake, Supplement acceptance  REASON FOR ASSESSMENT:   Malnutrition Screening Tool    ASSESSMENT:     Pt admitted with weakness and dehydration.   Past Medical History  Diagnosis Date  . GERD (gastroesophageal reflux disease)   . Heart attack ([place]) 1982  . Hypertension   . 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 Squamous cell carcinoma of right lung ([place]) 12/28/14    Wedge resection on 01/19/15, pathology reports 1.2 cm squamous cell carcinoma with negative margins  . History of chemotherapy     Completed cycle 2 chemo with Carboplatin/5FU on 12/26/11.  Marland Kitchen History of radiation therapy   . Vertigo   . Loss of appetite   . ETOH abuse      Diet changed to dysphagia 2 this am, noted ate 100% of breakfast. Full lunch tray untouched at bedside.  Pt reports back hurts and does not want to eat right now.  Reports appetite has been down prior to admission but can't tell me how long intake has been down. Reports I fell and didn't eat my Meals on Wheels meals times 2.  Medications reviewed:colace, Mag ox Labs reviewed: Mag 1.3  Nutrition-Focused physical exam completed. Findings are moderate to severe  fat depletion, moderate to severe muscle depletion, and no edema.     Diet Order:  DIET DYS 2 Room service appropriate?: Yes with Assist; Fluid consistency:: Thin  Skin:  Reviewed, no issues  Last BM:  5/21  Height:   Ht Readings from  Last 1 Encounters:  09/27/15 6' (1.829 m)    Weight: 8% wt loss noted in the last 8 months per wt encounters (new wt taken today via bed at visit 144 lbs 4 oz  Wt Readings from Last 1 Encounters:  09/27/15 144 lb 4 oz (65.431 kg)    Ideal Body Weight:     BMI:  Body mass index is 19.56 kg/(m^2).  Estimated Nutritional Needs:   Kcal:  1700-2000 kcals/d.   Protein:  82-102 g/d  Fluid:  1.7-2 L/d  EDUCATION NEEDS:   No education needs identified at this time  Adell Panek B. Zenia Resides, [place], [place] (pager) Weekend/On-Call pager [phone])

## 2015-09-27 NOTE — [place] Notes (Signed)
[place] at [facility]                                                                                                                                                                                            Patient Demographics   Vernon Hayes, is a 70 y.o. male, DOB - [birth year], QGB:[id number]  Admit date - 09/25/2015   Admitting Physician Hillary Bow, MD  Outpatient Primary MD for the patient is MASOUD,JAVED, MD   LOS - 2  Subjective:Patient states that he wants to go home and not rehabilitation.    Review of Systems:   CONSTITUTIONAL: No documented fever.Positive fatigue, positive weakness. No weight gain, no weight loss.  EYES: No blurry or double vision.  ENT: No tinnitus. No postnasal drip. No redness of the oropharynx.  RESPIRATORY: No cough, no wheeze, no hemoptysis. No dyspnea.  CARDIOVASCULAR: No chest pain. No orthopnea. No palpitations. No syncope.  GASTROINTESTINAL: No nausea, no vomiting or diarrhea. No abdominal pain. No melena or hematochezia.  GENITOURINARY: No dysuria or hematuria.  ENDOCRINE: No polyuria or nocturia. No heat or cold intolerance.  HEMATOLOGY: No anemia. No bruising. No bleeding.  INTEGUMENTARY: No rashes. No lesions.  MUSCULOSKELETAL: No arthritis. No swelling. No gout.  NEUROLOGIC: No numbness, tingling, or ataxia. No seizure-type activity.  PSYCHIATRIC: No anxiety. No insomnia. No ADD.    Vitals:   Filed Vitals:   09/26/15 1420 09/26/15 2114 09/27/15 0452 09/27/15 0500  BP: 131/87 139/87 143/100   Pulse: 58 63 66   Temp: 97.5 F (36.4 C) 97.5 F (36.4 C) 97.5 F (36.4 C)   TempSrc: Oral Oral Oral   Resp: '20 20 18   '$ Height:      Weight:    68.312 kg (150 lb 9.6 oz)  SpO2: 100% 100% 99%     Wt Readings from Last 3 Encounters:  09/27/15 68.312 kg (150 lb 9.6 oz)  09/23/15 63.504 kg (140 lb)  06/28/15 68.9 kg (151 lb 14.4 oz)     Intake/Output Summary (Last 24 hours) at 09/27/15  1329 Last data filed at 09/27/15 9758  Gross per 24 hour  Intake 1271.67 ml  Output      0 ml  Net 1271.67 ml    Physical Exam:   GENERAL: Pleasant-appearing Weak HEAD, EYES, EARS, NOSE AND THROAT: Atraumatic, normocephalic. Extraocular muscles are intact. Pupils equal and reactive to light. Sclerae anicteric. No conjunctival injection. No oro-pharyngeal erythema.  NECK: Supple. There is no jugular venous distention. No bruits, no lymphadenopathy, no thyromegaly.  HEART: Regular rate and rhythm,. No murmurs, no rubs, no clicks.  LUNGS:  Clear to auscultation bilaterally. No rales or rhonchi. No wheezes.  ABDOMEN: Soft, flat, nontender, nondistended. Has good bowel sounds. No hepatosplenomegaly appreciated.  EXTREMITIES: No evidence of any cyanosis, clubbing, or peripheral edema.  +2 pedal and radial pulses bilaterally.  NEUROLOGIC: The patient is alert, awake, and oriented x3 with no focal motor or sensory deficits appreciated bilaterally.  SKIN: Moist and warm with no rashes appreciated.  Psych: Not anxious, depressed LN: No inguinal LN enlargement    Antibiotics   Anti-infectives    None      Medications   Scheduled Meds: . aspirin EC  81 mg Oral Daily  . docusate sodium  100 mg Oral BID  . enoxaparin (LOVENOX) injection  40 mg Subcutaneous Q24H  . gabapentin  300 mg Oral BID  . magnesium oxide  400 mg Oral BID  . sodium chloride flush  3 mL Intravenous Q12H   Continuous Infusions: . 0.9 % NaCl with KCl 20 mEq / L 100 mL/hr at 09/27/15 0643   PRN Meds:.acetaminophen **OR** acetaminophen, albuterol, bisacodyl, HYDROcodone-acetaminophen, meclizine, ondansetron **OR** ondansetron (ZOFRAN) IV, polyethylene glycol   Data Review:   Micro Results No results found for this or any previous visit (from the past 240 hour(s)).  Radiology Reports Ct Head Wo Contrast  09/23/2015  CLINICAL DATA:  70 year old male with dizziness. Right lung cancer status post treatment. Initial  encounter. EXAM: CT HEAD WITHOUT CONTRAST TECHNIQUE: Contiguous axial images were obtained from the base of the skull through the vertex without intravenous contrast. COMPARISON:  Head CT 08/20/2015 and earlier. FINDINGS: Mild paranasal sinus mucosal thickening is new. Stable tympanic cavities and mastoids, chronic left mastoid sclerosis is unchanged. No acute osseous abnormality identified. There is a new broad-based posterior and superior scalp hematoma measuring up to 10 mm in thickness. No acute or suspicious calvarium finding identified. Calcified atherosclerosis at the skull base. Visualized orbit soft tissues are within normal limits. Stable cerebral volume. No ventriculomegaly. 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 No cortically based acute infarct identified. No acute intracranial hemorrhage identified. No midline shift, mass effect, or evidence of intracranial mass lesion. IMPRESSION: 1. Broad-based scalp hematoma, up to 10 mm in thickness. No underlying acute osseous abnormality. 2. Stable non contrast CT appearance of the brain. Moderately advanced chronic small vessel ischemia. 3. Note that early metastatic disease to the brain cannot be excluded in the absence of intravenous contrast. Electronically Signed   By: Genevie Ann M.D.   On: 09/23/2015 13:09   Dg Chest Portable 1 View  09/25/2015  CLINICAL DATA:  70 year old male with a history of weakness and left shoulder pain EXAM: PORTABLE CHEST 1 VIEW COMPARISON:  06/25/2015 FINDINGS: Cardiomediastinal silhouette unchanged in size and contour. Tortuosity of descending thoracic aorta. Calcifications of the aortic arch. Right upper lung nodule/mass, with surgical changes, better characterized on recent CT. Unchanged position of right IJ approach port catheter. Degenerative changes of  bilateral shoulders. IMPRESSION: Chronic lung changes without evidence of acute cardiopulmonary disease. Right upper lobe surgical changes/nodularity, better characterized on prior chest CT. Unchanged right IJ port catheter. Degenerative changes bilateral shoulders. Signed, Dulcy Fanny. Earleen Newport, DO [facility] Radiology Electronically Signed   By: Corrie Mckusick D.O.   On: 09/25/2015 11:23   Dg Shoulder Left  09/25/2015  CLINICAL DATA:  Pain. EXAM: LEFT SHOULDER - 2+ VIEW COMPARISON:  Sep 23, 2015 FINDINGS: Again noted are destructive changes in the distal clavicle and AC joint, unchanged since 2 days ago. There is a rounded lucency in the humeral head as well, also stable. Irregularity along the humeral head is stable and chronic in appearance. No other acute abnormalities. IMPRESSION: No acute fracture all dislocation. Continued destructive change in the distal clavicle and AC joint as well as a rounded lucency in the humeral head. The patient has a history of lung cancer and metastatic involvement of the bones is not excluded. However, there are other potential processes, as described on the previous report, that could cause these changes. Electronically Signed   By: Dorise Bullion III M.D   On: 09/25/2015 14:25   Dg Shoulder Left  09/23/2015  CLINICAL DATA:  Recent fall, LEFT shoulder pain, poor historian, history RIGHT lung cancer post chemotherapy, radiation therapy and thoracotomy, hypertension, smoker, ethanol abuse EXAM: LEFT SHOULDER - 2+ VIEW COMPARISON:  LEFT shoulder radiographs 09/02/2015; chest radiograph 02/01/2015 FINDINGS: Osseous demineralization. Destructive changes at LEFT [facility] joint involving both the acromion and distal clavicle. Joint space is no longer identified and resorption of the distal clavicle is likely present for 2 cm length. Additional question lucent focus within the LEFT humeral head. No glenohumeral fracture or dislocation.  Visualized LEFT ribs unremarkable. IMPRESSION: Increased bone destruction at LEFT [facility] joint involving both distal clavicle and acromion progressive since 09/02/2015. Question additional lucent lesion at LEFT humeral head as well. Findings could be due to lytic metastatic disease in a patient with a history of lung cancer, though a destructive joint process at the [facility] joint such as gout or septic arthritis could cause similar bone destruction. Consider followup MR imaging to evaluate. Electronically Signed   By: Lavonia Dana M.D.   On: 09/23/2015 17:25   Dg Shoulder Left  09/02/2015  CLINICAL DATA:  Fall. Injury. Initial evaluation. Prior history of lung cancer. EXAM: LEFT SHOULDER - 2+ VIEW COMPARISON:  CT 04/23/2016 .  Chest x-ray 02/01/2015 FINDINGS: A destructive lesion(s) involving the distal clavicle and acromion appears to be present. MRI left shoulder suggested for further evaluation. No evidence of fracture or dislocation. IMPRESSION: Destructive lesion(s) noted involving the distal left clavicle and left acromion. This is new from prior study of 02/01/2015. This could represent a site of metastatic disease. Gadolinium-enhanced MRI left shoulder suggested for further evaluation. Electronically Signed   By: Marcello Moores  Register   On: 09/02/2015 10:12     CBC  Recent Labs Lab 09/23/15 1500 09/25/15 1034 09/26/15 0845  WBC 6.2 6.5 4.6  HGB 13.4 13.6 11.5*  HCT 39.3* 40.2 34.4*  PLT 183 194 169  MCV 105.1* 106.4* 106.6*  MCH 35.8* 36.0* 35.6*  MCHC 34.1 33.9 33.4  RDW 14.5 14.0 14.2  LYMPHSABS  --  0.7*  --   MONOABS  --  0.7  --   EOSABS  --  0.1  --   BASOSABS  --  0.0  --     Chemistries   Recent Labs Lab 09/23/15 1500 09/25/15 1034 09/25/15 1418 09/26/15 0845 09/27/15 0615  NA 139 142  --  142 139  K 3.4* 3.3*  --  4.4 4.1  CL 102 104  --  114* 113*  CO2 26 31  --  26 25  GLUCOSE 95 109*  --  92 97  BUN 23* 23*  --  19 14  CREATININE 1.09 1.51*  --  1.11 1.12  CALCIUM  11.5* 12.3*  --  10.6* 10.1  MG  --   --  1.5*  --  1.3*  AST 17 15  --  13*  --   ALT 12* 10*  --  9*  --   ALKPHOS 49 48  --  42  --   BILITOT 1.7* 0.7  --  0.3  --    ------------------------------------------------------------------------------------------------------------------ estimated creatinine clearance is 58.4 mL/min (by C-G formula based on Cr of 1.12). ------------------------------------------------------------------------------------------------------------------ No results for input(s): HGBA1C in the last 72 hours. ------------------------------------------------------------------------------------------------------------------ No results for input(s): CHOL, HDL, LDLCALC, TRIG, CHOLHDL, LDLDIRECT in the last 72 hours. ------------------------------------------------------------------------------------------------------------------  Recent Labs  09/25/15 1418  TSH 1.429   ------------------------------------------------------------------------------------------------------------------ No results for input(s): VITAMINB12, FOLATE, FERRITIN, TIBC, IRON, RETICCTPCT in the last 72 hours.  Coagulation profile No results for input(s): INR, PROTIME in the last 168 hours.  No results for input(s): DDIMER in the last 72 hours.  Cardiac Enzymes  Recent Labs Lab 09/23/15 1500 09/25/15 1034  TROPONINI <0.03 <0.03   ------------------------------------------------------------------------------------------------------------------ Invalid input(s): POCBNP    Assessment & Plan   Patient is a 70 year old with known history of tongue and lung cancer status post resection comes in with pain and recurrent falls  *Hypercalcemia Due to severe dehydration Resolved with IV hydration  * Severe protein calorie malnutrition Add megace to current therpay Recently seen by Dr. Weber Cooks of psychiatry and thought to have competency.  * Hypertension Blood pressure elevated reume bp  meds from home  * Left shoulder pain Chronic x-ray of the shoulder without any acute abnormality  * History of tongue and lung cancer outpatient follow-up as before   * Disposition patient needs rehabilitation but he is refusing a have talked to his healthcare power of attorney try to convince him to go there  * DVT prophylaxis with Lovenox     Code Status Orders        Start     Ordered   09/25/15 1327  Full code   Continuous     09/25/15 1327    Code Status History    Date Active Date Inactive Code Status Order ID Comments User Context   01/19/2015  3:47 PM 01/20/2015 11:13 AM Full Code [id number]  Nestor Lewandowsky, MD Inpatient           Consults  pt  DVT Prophylaxis  Lovenox    Lab Results  Component Value Date   PLT 169 09/26/2015     Time Spent in minutes  30mn Greater than 50% of time spent in care coordination and counseling patient regarding the condition and plan of care.   PDustin FlockM.D on 09/27/2015 at 1:29 PM  Between 7am to 6pm - Pager - [phone]  After 6pm go to [email or web address] - password [email or web address] [email or web address] ParkHospitalists   Office  [phone]

## 2015-09-28 LAB — MAGNESIUM: MAGNESIUM: 2.2 mg/dL (ref 1.7–2.4)

## 2015-09-28 MED ORDER — HEPARIN SOD (PORK) LOCK FLUSH 100 UNIT/ML IV SOLN
500.0000 [IU] | Freq: Once | INTRAVENOUS | Status: AC
  Administered 2015-09-28: 500 [IU] via INTRAVENOUS
  Filled 2015-09-28: qty 5

## 2015-09-28 MED ORDER — DOCUSATE SODIUM 100 MG PO CAPS: 100.0000 mg | ORAL_CAPSULE | Freq: Two times a day (BID) | ORAL | Status: DC

## 2015-09-28 MED ORDER — POLYETHYLENE GLYCOL 3350 17 G PO PACK: 17.0000 g | PACK | Freq: Every day | ORAL | Status: DC | PRN

## 2015-09-28 MED ORDER — ASPIRIN 81 MG PO TBEC: 81.0000 mg | DELAYED_RELEASE_TABLET | Freq: Every day | ORAL | Status: DC

## 2015-09-28 MED ORDER — HYDROCODONE-ACETAMINOPHEN 5-325 MG PO TABS: 1.0000 | ORAL_TABLET | ORAL | Status: DC | PRN

## 2015-09-28 NOTE — Progress Notes (Signed)
Speech Language Pathology Treatment: Dysphagia  Patient Details Name: STAFFORD RIVIERA MRN: [id number] DOB: Sep 28, 1945 Today's Date: 09/28/2015 Time: 0820-0900 SLP Time Calculation (min) (ACUTE ONLY): 40 min  Assessment / Plan / Recommendation Clinical Impression  Pt appears to be able to adequately tolerate chopped/broken down soft foods w/ sips of thin liquids via cup then straw w/ no immediate, overt s/s of aspiration noted. Pt exhibited min wet vocal quality PRIOR to presentation of po's w/ native saliva during conversation; this was noted intermittently during po trials. Pt often throat cleared and it resolved for a few minutes during conversation and w/ w/ po intake. Pt exhibited no decline in respiratory status during/post trials and no overt coughing or wet vocal quality appreciated w/ trials. Unsure of the impact of h/o tongue Ca and radiation tx on pt's swallow function. Pt does exhibit a mildly wet vocal quality at rest with and w/out po intake - pt may not have full sensation for swallow response to clear own saliva. Pt demonstrated increased mastication effort and time w/ increased textured foods. He swallowed and cleared appropriately given time. Unsure if this presentation is impacted by declined Cognitive status or dentition, or both.   Suspect pt is at mild increased risk for aspiration d/t illness and his baseline of tongue Ca w/ radiation tx; Cognitive status, dentition. Recommend a Dysphagia 2 diet for the broken down foods to reduce effort of mastication; thin liquids via cup(straw if no coughing observed by staff) following aspiration precautions including small, single sips. Recommend meds given in Puree - crushed if able. ST services will f/u for toleration of diet and education w/ staff for pt assistance during meals as necessary. Recommend staff monitor pt's pulmonary status for any decline; Dietician f/u for recommendation of supplements.    HPI  Pt is a 70 y.o. 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 Patient was recently seen in the emergency room on 09/23/2015 had a psychiatry evaluation where he was found to be competent to make his own decisions and at that time declined admission then. When pt has been       SLP Plan  Continue with current plan of care     Recommendations  Diet recommendations: Dysphagia 2 (fine chop);Thin liquid Liquids provided via: Cup;Straw Medication Administration: Whole meds with puree (icrushed if necessary/able; liquid form is available) Supervision: Staff to assist with self feeding;Intermittent supervision to cue for compensatory strategies Compensations: Minimize environmental distractions;Slow rate;Small sips/bites;Follow solids with liquid Postural Changes and/or Swallow Maneuvers: Seated upright 90 degrees             General recommendations:  (Dietician) Oral Care Recommendations: Oral care BID;Staff/trained caregiver to provide oral care Follow up Recommendations: Skilled Nursing facility (TBD) Plan: Continue with current plan of care     [place], [place], CCC-SLP  Watson,Katherine 09/28/2015, 1:57 PM

## 2015-09-28 NOTE — Progress Notes (Signed)
PT Cancellation Note  Patient Details Name: Vernon Hayes MRN: [id number] DOB: 1945/07/22   Cancelled Treatment:    Reason Eval/Treat Not Completed: Patient declined, no reason specified.  Pt sitting on edge of bed upon PT arrival with bed alarm on (level 3).  PT spend 10 minutes attempting to get pt to participate in PT but pt declining and pt eventually laying back down in bed on his own.  Bed alarm on end of session (only level 3 alarm working; nursing notified who reported she would go into room to assess bed alarm).  Will re-attempt PT treatment at a later date/time.   Raquel Sarna Angelyn Osterberg 09/28/2015, 10:33 AM Leitha Bleak, [place]

## 2015-09-28 NOTE — Discharge Summary (Signed)
Vernon Hayes, 70 y.o., DOB October 11, 1945, MRN [id number]. Admission date: 09/25/2015 Discharge Date 09/28/2015 Primary MD Cletis Athens, MD Admitting Physician Hillary Bow, MD  Admission Diagnosis  Weakness [R53.1] Shoulder pain, acute, left [M25.512]  Discharge Diagnosis   Active Problems:   Hypercalcemia   Severe Caloric protein malnutrion   HTN   Left should pain   H/o tongue and lung cancer   Generalized weakness and deconditioning   Dehydration        Hospital Course Vernon Hayes is a 70 y.o. male with a known history of Tongue and lung cancer status post resection, hypertension presents to the emergency room due to left shoulder pain and weakness with recurrent falls. Patient was recently seen in the emergency room on 09/23/2015 had a psychiatry evaluation where he was found to be competent to make his own decisions. Patient was brought to the ER again with generalized weakness and deconditioning. Patient was noted to have hypercalcemia. And appeared very dehydrated. He was admitted to the hospital aggressive IV fluids were provided to the patient is calcium level is improved. Patient is very deconditioned and weak and needs physical therapy and rehabilitation.            Consults  None  Significant Tests:  See full reports for all details      Ct Head Wo Contrast  09/23/2015  CLINICAL DATA:  70 year old male with dizziness. Right lung cancer status post treatment. Initial encounter. EXAM: CT HEAD WITHOUT CONTRAST TECHNIQUE: Contiguous axial images were obtained from the base of the skull through the vertex without intravenous contrast. COMPARISON:  Head CT 08/20/2015 and earlier. FINDINGS: Mild paranasal sinus mucosal thickening is new. Stable tympanic cavities and mastoids, chronic left mastoid sclerosis is unchanged. No acute osseous abnormality identified. There is a new broad-based posterior and superior scalp hematoma measuring up to 10 mm in thickness. No acute  or suspicious calvarium finding identified. Calcified atherosclerosis at the skull base. Visualized orbit soft tissues are within normal limits. Stable cerebral volume. No ventriculomegaly. 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 No cortically based acute infarct identified. No acute intracranial hemorrhage identified. No midline shift, mass effect, or evidence of intracranial mass lesion. IMPRESSION: 1. Broad-based scalp hematoma, up to 10 mm in thickness. No underlying acute osseous abnormality. 2. Stable non contrast CT appearance of the brain. Moderately advanced chronic small vessel ischemia. 3. Note that early metastatic disease to the brain cannot be excluded in the absence of intravenous contrast. Electronically Signed   By: Genevie Ann M.D.   On: 09/23/2015 13:09   Dg Chest Portable 1 View  09/25/2015  CLINICAL DATA:  70 year old male with a history of weakness and left shoulder pain EXAM: PORTABLE CHEST 1 VIEW COMPARISON:  06/25/2015 FINDINGS: Cardiomediastinal silhouette unchanged in size and contour. Tortuosity of descending thoracic aorta. Calcifications of the aortic arch. Right upper lung nodule/mass, with surgical changes, better characterized on recent CT. Unchanged position of right IJ approach port catheter. Degenerative changes of bilateral shoulders. IMPRESSION: Chronic lung changes without evidence of acute cardiopulmonary disease. Right upper lobe surgical changes/nodularity, better characterized on prior chest CT. Unchanged right IJ port catheter. Degenerative changes bilateral shoulders. Signed, Dulcy Fanny. Earleen Newport, DO [facility] Electronically Signed   By: Corrie Mckusick D.O.   On: 09/25/2015 11:23   Dg Shoulder Left  09/25/2015  CLINICAL DATA:  Pain. EXAM: LEFT  SHOULDER - 2+ VIEW COMPARISON:  Sep 23, 2015 FINDINGS: Again noted are destructive changes in the distal clavicle and AC joint, unchanged since 2 days ago. There is a rounded lucency in the humeral head as well, also stable. Irregularity along the humeral head is stable and chronic in appearance. No other acute abnormalities. IMPRESSION: No acute fracture all dislocation. Continued destructive change in the distal clavicle and AC joint as well as a rounded lucency in the humeral head. The patient has a history of lung cancer and metastatic involvement of the bones is not excluded. However, there are other potential processes, as described on the previous report, that could cause these changes. Electronically Signed   By: Dorise Bullion III M.D   On: 09/25/2015 14:25   Dg Shoulder Left  09/23/2015  CLINICAL DATA:  Recent fall, LEFT shoulder pain, poor historian, history RIGHT lung cancer post chemotherapy, radiation therapy and thoracotomy, hypertension, smoker, ethanol abuse EXAM: LEFT SHOULDER - 2+ VIEW COMPARISON:  LEFT shoulder radiographs 09/02/2015; chest radiograph 02/01/2015 FINDINGS: Osseous demineralization. Destructive changes at LEFT [facility] joint involving both the acromion and distal clavicle. Joint space is no longer identified and resorption of the distal clavicle is likely present for 2 cm length. Additional question lucent focus within the LEFT humeral head. No glenohumeral fracture or dislocation. Visualized LEFT ribs unremarkable. IMPRESSION: Increased bone destruction at LEFT [facility] joint involving both distal clavicle and acromion progressive since 09/02/2015. Question additional lucent lesion at LEFT humeral head as well. Findings could be due to lytic metastatic disease in a patient with a history of lung cancer, though a destructive joint process at the [facility] joint such as gout or septic arthritis could cause similar bone destruction. Consider followup MR imaging to evaluate. Electronically Signed    By: Lavonia Dana M.D.   On: 09/23/2015 17:25   Dg Shoulder Left  09/02/2015  CLINICAL DATA:  Fall. Injury. Initial evaluation. Prior history of lung cancer. EXAM: LEFT SHOULDER - 2+ VIEW COMPARISON:  CT 04/23/2016 .  Chest x-ray 02/01/2015 FINDINGS: A destructive lesion(s) involving the distal clavicle and acromion appears to be present. MRI left shoulder suggested for further evaluation. No evidence of fracture or dislocation. IMPRESSION: Destructive lesion(s) noted involving the distal left clavicle and left acromion. This is new from prior study of 02/01/2015. This could represent a site of metastatic disease. Gadolinium-enhanced MRI left shoulder suggested for further evaluation. Electronically Signed   By: Marcello Moores  Register   On: 09/02/2015 10:12       Today   Subjective:   Vernon Hayes  Feels better but still very weak  Objective:   Blood pressure 162/89, pulse 55, temperature 97.7 F (36.5 C), temperature source Oral, resp. rate 18, height 6' (1.829 m), weight 69.128 kg (152 lb 6.4 oz), SpO2 100 %.  .  Intake/Output Summary (Last 24 hours) at 09/28/15 1148 Last data filed at 09/28/15 0900  Gross per 24 hour  Intake    240 ml  Output      0 ml  Net    240 ml    Exam VITAL SIGNS: Blood pressure 162/89, pulse 55, temperature 97.7 F (36.5 C), temperature source Oral, resp. rate 18, height 6' (1.829 m), weight 69.128 kg (152 lb 6.4 oz), SpO2 100 %.  GENERAL:  70 y.o.-year-old patient lying in the bed with no acute distress.  EYES: Pupils equal, round, reactive to light and accommodation. No scleral icterus. Extraocular muscles intact.  HEENT: Head atraumatic,  normocephalic. Oropharynx and nasopharynx clear.  NECK:  Supple, no jugular venous distention. No thyroid enlargement, no tenderness.  LUNGS: Normal breath sounds bilaterally, no wheezing, rales,rhonchi or crepitation. No use of accessory muscles of respiration.  CARDIOVASCULAR: S1, S2 normal. No murmurs, rubs, or gallops.   ABDOMEN: Soft, nontender, nondistended. Bowel sounds present. No organomegaly or mass.  EXTREMITIES: No pedal edema, cyanosis, or clubbing.  NEUROLOGIC: Cranial nerves II through XII are intact. Muscle strength 5/5 in all extremities. Sensation intact. Gait not checked.  PSYCHIATRIC: The patient is alert and oriented x 3.  SKIN: No obvious rash, lesion, or ulcer.   Data Review     CBC w Diff: Lab Results  Component Value Date   WBC 4.6 09/26/2015   WBC 5.5 06/05/2014   HGB 11.5* 09/26/2015   HGB 14.5 06/05/2014   HCT 34.4* 09/26/2015   HCT 43.9 06/05/2014   PLT 169 09/26/2015   PLT 195 06/05/2014   LYMPHOPCT 10 09/25/2015   LYMPHOPCT 16.5 06/05/2014   MONOPCT 12 09/25/2015   MONOPCT 11.8 06/05/2014   EOSPCT 1 09/25/2015   EOSPCT 8.4 06/05/2014   BASOPCT 1 09/25/2015   BASOPCT 1.1 06/05/2014   CMP: Lab Results  Component Value Date   NA 139 09/27/2015   NA 144 06/05/2014   K 4.1 09/27/2015   K 3.7 06/05/2014   CL 113* 09/27/2015   CL 104 06/05/2014   CO2 25 09/27/2015   CO2 32 06/05/2014   BUN 14 09/27/2015   BUN 11 06/05/2014   CREATININE 1.12 09/27/2015   CREATININE 1.26 06/05/2014   PROT 6.0* 09/26/2015   PROT 7.7 06/05/2014   ALBUMIN 2.9* 09/26/2015   ALBUMIN 3.6 06/05/2014   BILITOT 0.3 09/26/2015   BILITOT 0.6 06/05/2014   ALKPHOS 42 09/26/2015   ALKPHOS 84 06/05/2014   AST 13* 09/26/2015   AST 16 06/05/2014   ALT 9* 09/26/2015   ALT 7* 06/05/2014  .  Micro Results No results found for this or any previous visit (from the past 240 hour(s)).      Code Status Orders        Start     Ordered   09/25/15 1327  Full code   Continuous     09/25/15 1327    Code Status History    Date Active Date Inactive Code Status Order ID Comments User Context   01/19/2015  3:47 PM 01/20/2015 11:13 AM Full Code [id number]  Nestor Lewandowsky, MD Inpatient          Follow-up Information    Follow up with md at snf In 7 days.      Discharge Medications      Medication List    STOP taking these medications        oxyCODONE 5 MG immediate release tablet  Commonly known as:  Oxy IR/ROXICODONE      TAKE these medications        aspirin 81 MG EC tablet  Take 1 tablet (81 mg total) by mouth daily.     docusate sodium 100 MG capsule  Commonly known as:  COLACE  Take 1 capsule (100 mg total) by mouth 2 (two) times daily.     gabapentin 300 MG capsule  Commonly known as:  NEURONTIN  Take 1 capsule (300 mg total) by mouth 2 (two) times daily.     hydrochlorothiazide 25 MG tablet  Commonly known as:  HYDRODIURIL  Take 25 mg by mouth daily.     HYDROcodone-acetaminophen 5-325  MG tablet  Commonly known as:  NORCO/VICODIN  Take 1-2 tablets by mouth every 4 (four) hours as needed for moderate pain.     meclizine 25 MG tablet  Commonly known as:  ANTIVERT  Take 1 tablet (25 mg total) by mouth 3 (three) times daily as needed for dizziness.     metoprolol tartrate 25 MG tablet  Commonly known as:  LOPRESSOR  Take 25 mg by mouth daily.     polyethylene glycol packet  Commonly known as:  MIRALAX / GLYCOLAX  Take 17 g by mouth daily as needed for mild constipation.           Total Time in preparing paper work, data evaluation and todays exam - 35 minutes  Dustin Flock M.D on 09/28/2015 at 11:48 AM  [facility]   Office  [phone]

## 2015-09-28 NOTE — Progress Notes (Signed)
Report given to [place] at [facility]. Madlyn Frankel, RN

## 2015-09-28 NOTE — Progress Notes (Signed)
Patient discharged via EMS to [facility]. Dorma Altman S, RN  

## 2015-09-28 NOTE — Discharge Instructions (Signed)
°  DIET:  Cardiac diet  DISCHARGE CONDITION:  Stable  ACTIVITY:  Activity as tolerated  OXYGEN:  Home Oxygen: No.   Oxygen Delivery: room air  DISCHARGE LOCATION:  SNF  ADDITIONAL DISCHARGE INSTRUCTION:   If you experience worsening of your admission symptoms, develop shortness of breath, life threatening emergency, suicidal or homicidal thoughts you must seek medical attention immediately by calling 911 or calling your MD immediately  if symptoms less severe.  You Must read complete instructions/literature along with all the possible adverse reactions/side effects for all the Medicines you take and that have been prescribed to you. Take any new Medicines after you have completely understood and accpet all the possible adverse reactions/side effects.   Please note  You were cared for by a hospitalist during your hospital stay. If you have any questions about your discharge medications or the care you received while you were in the hospital after you are discharged, you can call the unit and asked to speak with the hospitalist on call if the hospitalist that took care of you is not available. Once you are discharged, your primary care physician will handle any further medical issues. Please note that NO REFILLS for any discharge medications will be authorized once you are discharged, as it is imperative that you return to your primary care physician (or establish a relationship with a primary care physician if you do not have one) for your aftercare needs so that they can reassess your need for medications and monitor your lab values.

## 2015-09-28 NOTE — Care Management Important Message (Signed)
Important Message  Patient Details  Name: Vernon Hayes MRN: [id number] Date of Birth: [birth year]   Medicare Important Message Given:  Yes    Juliann Pulse A Nicey Krah 09/28/2015, 11:09 AM

## 2015-09-28 NOTE — Clinical Social Work Placement (Signed)
   CLINICAL SOCIAL WORK PLACEMENT  NOTE  Date:  09/28/2015  Patient Details  Name: Vernon Hayes MRN: [id number] Date of Birth: [birth year]  Clinical Social Work is seeking post-discharge placement for this patient at the [place] level of care (*CSW will initial, date and re-position this form in  chart as items are completed):  Yes   Patient/family provided with [place] Work Department's list of facilities offering this level of care within the geographic area requested by the patient (or if unable, by the patient's family).  Yes   Patient/family informed of their freedom to choose among providers that offer the needed level of care, that participate in Medicare, Medicaid or managed care program needed by the patient, have an available bed and are willing to accept the patient.  Yes   Patient/family informed of [facility]'s ownership interest in [facility] and [facility], as well as of the fact that they are under no obligation to receive care at these facilities.  PASRR submitted to EDS on 09/26/15     PASRR number received on 09/26/15     Existing PASRR number confirmed on       FL2 transmitted to all facilities in geographic area requested by pt/family on 09/27/15     FL2 transmitted to all facilities within larger geographic area on       Patient informed that his/her managed care company has contracts with or will negotiate with certain facilities, including the following:        Yes   Patient/family informed of bed offers received.  Patient chooses bed at [facility]     Physician recommends and patient chooses bed at  [facility])    Patient to be transferred to [facility] on 09/28/15.  Patient to be transferred to facility by [facility]     Patient family notified on 09/28/15 of transfer.  Name of family member notified:  Vira Agar, cousin     PHYSICIAN       Additional Comment:     _______________________________________________ Darden Dates, LCSW 09/28/2015, 2:06 PM

## 2015-09-28 NOTE — Progress Notes (Signed)
EMS called for transport. Keirsten Matuska S, RN  

## 2015-09-28 NOTE — Clinical Social Work Note (Signed)
Pt is ready for discharge today to [facility]. CSW presented bed offers. Pt was not in agreement at first, however is now agreement and will try it. Pt and cousin are aware and agreement to discharge plan. CSW updated Rory Percy with [facility] DSS APS. Facility is able to accept pt as they have received discharge information. RN called report. EMS will provide transportation. CSW is signing off as no further needs identified.   Vernon Hayes, MSW, LCSW  Clinical Social Worker  [phone]

## 2015-10-03 LAB — PTH-RELATED PEPTIDE: PTH-related peptide: <1.1 pmol/L

## 2015-10-22 NOTE — Telephone Encounter (Signed)
Meclizine sent in to pharmacy

## 2015-10-26 ENCOUNTER — Observation Stay
Admission: EM | Admit: 2015-10-26 | Discharge: 2015-10-28 | Disposition: A | Discharge status: Skilled Nursing Facility | Payer: Medicare Other | Attending: Internal Medicine | Admitting: Internal Medicine

## 2015-10-26 ENCOUNTER — Other Ambulatory Visit: Payer: Self-pay

## 2015-10-26 ENCOUNTER — Emergency Department: Payer: Medicare Other

## 2015-10-26 DIAGNOSIS — F05 Delirium due to known physiological condition: Secondary | ICD-10-CM | POA: Diagnosis not present

## 2015-10-26 DIAGNOSIS — Z85118 Personal history of other malignant neoplasm of bronchus and lung: Secondary | ICD-10-CM | POA: Diagnosis not present

## 2015-10-26 DIAGNOSIS — Z9221 Personal history of antineoplastic chemotherapy: Secondary | ICD-10-CM | POA: Insufficient documentation

## 2015-10-26 DIAGNOSIS — Z9181 History of falling: Secondary | ICD-10-CM | POA: Insufficient documentation

## 2015-10-26 DIAGNOSIS — Z881 Allergy status to other antibiotic agents status: Secondary | ICD-10-CM | POA: Insufficient documentation

## 2015-10-26 DIAGNOSIS — J690 Pneumonitis due to inhalation of food and vomit: Secondary | ICD-10-CM

## 2015-10-26 DIAGNOSIS — Z85810 Personal history of malignant neoplasm of tongue: Secondary | ICD-10-CM | POA: Insufficient documentation

## 2015-10-26 DIAGNOSIS — Z800 Family history of malignant neoplasm of digestive organs: Secondary | ICD-10-CM | POA: Insufficient documentation

## 2015-10-26 DIAGNOSIS — Z79891 Long term (current) use of opiate analgesic: Secondary | ICD-10-CM | POA: Insufficient documentation

## 2015-10-26 DIAGNOSIS — Z833 Family history of diabetes mellitus: Secondary | ICD-10-CM | POA: Insufficient documentation

## 2015-10-26 DIAGNOSIS — F0390 Unspecified dementia without behavioral disturbance: Secondary | ICD-10-CM | POA: Insufficient documentation

## 2015-10-26 DIAGNOSIS — E876 Hypokalemia: Secondary | ICD-10-CM | POA: Diagnosis not present

## 2015-10-26 DIAGNOSIS — F17210 Nicotine dependence, cigarettes, uncomplicated: Secondary | ICD-10-CM | POA: Diagnosis not present

## 2015-10-26 DIAGNOSIS — Z79899 Other long term (current) drug therapy: Secondary | ICD-10-CM | POA: Diagnosis not present

## 2015-10-26 DIAGNOSIS — K219 Gastro-esophageal reflux disease without esophagitis: Secondary | ICD-10-CM | POA: Insufficient documentation

## 2015-10-26 DIAGNOSIS — Z9049 Acquired absence of other specified parts of digestive tract: Secondary | ICD-10-CM | POA: Insufficient documentation

## 2015-10-26 DIAGNOSIS — E43 Unspecified severe protein-calorie malnutrition: Secondary | ICD-10-CM | POA: Diagnosis not present

## 2015-10-26 DIAGNOSIS — I2510 Atherosclerotic heart disease of native coronary artery without angina pectoris: Secondary | ICD-10-CM | POA: Diagnosis not present

## 2015-10-26 DIAGNOSIS — Z7982 Long term (current) use of aspirin: Secondary | ICD-10-CM | POA: Diagnosis not present

## 2015-10-26 DIAGNOSIS — I639 Cerebral infarction, unspecified: Secondary | ICD-10-CM | POA: Diagnosis present

## 2015-10-26 DIAGNOSIS — R1310 Dysphagia, unspecified: Secondary | ICD-10-CM | POA: Diagnosis present

## 2015-10-26 DIAGNOSIS — I10 Essential (primary) hypertension: Secondary | ICD-10-CM | POA: Insufficient documentation

## 2015-10-26 DIAGNOSIS — F1021 Alcohol dependence, in remission: Secondary | ICD-10-CM | POA: Insufficient documentation

## 2015-10-26 DIAGNOSIS — Z923 Personal history of irradiation: Secondary | ICD-10-CM | POA: Insufficient documentation

## 2015-10-26 DIAGNOSIS — Z8589 Personal history of malignant neoplasm of other organs and systems: Secondary | ICD-10-CM | POA: Insufficient documentation

## 2015-10-26 DIAGNOSIS — A419 Sepsis, unspecified organism: Secondary | ICD-10-CM

## 2015-10-26 DIAGNOSIS — Z681 Body mass index (BMI) 19 or less, adult: Secondary | ICD-10-CM | POA: Insufficient documentation

## 2015-10-26 DIAGNOSIS — N289 Disorder of kidney and ureter, unspecified: Secondary | ICD-10-CM | POA: Diagnosis present

## 2015-10-26 DIAGNOSIS — Z8379 Family history of other diseases of the digestive system: Secondary | ICD-10-CM | POA: Insufficient documentation

## 2015-10-26 DIAGNOSIS — I252 Old myocardial infarction: Secondary | ICD-10-CM | POA: Insufficient documentation

## 2015-10-26 DIAGNOSIS — R4182 Altered mental status, unspecified: Secondary | ICD-10-CM | POA: Diagnosis not present

## 2015-10-26 DIAGNOSIS — Z9889 Other specified postprocedural states: Secondary | ICD-10-CM | POA: Insufficient documentation

## 2015-10-26 DIAGNOSIS — Z801 Family history of malignant neoplasm of trachea, bronchus and lung: Secondary | ICD-10-CM | POA: Insufficient documentation

## 2015-10-26 DIAGNOSIS — Z823 Family history of stroke: Secondary | ICD-10-CM | POA: Insufficient documentation

## 2015-10-26 LAB — URINALYSIS COMPLETE WITH MICROSCOPIC (ARMC ONLY)
Bilirubin Urine: NEGATIVE
Glucose, UA: NEGATIVE mg/dL
KETONES UR: NEGATIVE mg/dL
Leukocytes, UA: NEGATIVE
NITRITE: NEGATIVE
PH: 5 (ref 5.0–8.0)
PROTEIN: NEGATIVE mg/dL
Specific Gravity, Urine: 1.02 (ref 1.005–1.030)

## 2015-10-26 LAB — COMPREHENSIVE METABOLIC PANEL
ALT: 7 U/L — ABNORMAL LOW (ref 17–63)
AST: 14 U/L — AB (ref 15–41)
Albumin: 3.5 g/dL (ref 3.5–5.0)
Alkaline Phosphatase: 50 U/L (ref 38–126)
Anion gap: 10 (ref 5–15)
BUN: 29 mg/dL — ABNORMAL HIGH (ref 6–20)
CHLORIDE: 100 mmol/L — AB (ref 101–111)
CO2: 27 mmol/L (ref 22–32)
Calcium: 12.2 mg/dL — ABNORMAL HIGH (ref 8.9–10.3)
Creatinine, Ser: 1.45 mg/dL — ABNORMAL HIGH (ref 0.61–1.24)
GFR, EST AFRICAN AMERICAN: 54 mL/min — AB (ref >60)
GFR, EST NON AFRICAN AMERICAN: 47 mL/min — AB (ref >60)
Glucose, Bld: 95 mg/dL (ref 65–99)
POTASSIUM: 3.4 mmol/L — AB (ref 3.5–5.1)
Sodium: 137 mmol/L (ref 135–145)
Total Bilirubin: 1.1 mg/dL (ref 0.3–1.2)
Total Protein: 7.9 g/dL (ref 6.5–8.1)

## 2015-10-26 LAB — BLOOD GAS, VENOUS
ACID-BASE EXCESS: 5.7 mmol/L — AB (ref 0.0–3.0)
Bicarbonate: 31.7 mEq/L — ABNORMAL HIGH (ref 21.0–28.0)
PATIENT TEMPERATURE: 37
PCO2 VEN: 50 mmHg (ref 44.0–60.0)
pH, Ven: 7.41 (ref 7.320–7.430)

## 2015-10-26 LAB — CBC WITH DIFFERENTIAL/PLATELET
Basophils Absolute: 0 10*3/uL (ref 0–0.1)
Basophils Relative: 1 %
Eosinophils Absolute: 0 10*3/uL (ref 0–0.7)
Eosinophils Relative: 1 %
HCT: 37.1 % — ABNORMAL LOW (ref 40.0–52.0)
HEMOGLOBIN: 12.9 g/dL — AB (ref 13.0–18.0)
LYMPHS ABS: 1.1 10*3/uL (ref 1.0–3.6)
LYMPHS PCT: 15 %
MCH: 34.4 pg — AB (ref 26.0–34.0)
MCHC: 34.9 g/dL (ref 32.0–36.0)
MCV: 98.7 fL (ref 80.0–100.0)
Monocytes Absolute: 1.4 10*3/uL — ABNORMAL HIGH (ref 0.2–1.0)
Monocytes Relative: 20 %
NEUTROS ABS: 4.7 10*3/uL (ref 1.4–6.5)
NEUTROS PCT: 63 %
Platelets: 199 10*3/uL (ref 150–440)
RBC: 3.76 MIL/uL — AB (ref 4.40–5.90)
RDW: 13.4 % (ref 11.5–14.5)
WBC: 7.2 10*3/uL (ref 3.8–10.6)

## 2015-10-26 LAB — LACTIC ACID, PLASMA: Lactic Acid, Venous: 1.1 mmol/L (ref 0.5–2.0)

## 2015-10-26 LAB — TROPONIN I: Troponin I: <0.03 ng/mL (ref <0.031)

## 2015-10-26 MED ORDER — ENOXAPARIN SODIUM 40 MG/0.4ML ~~LOC~~ SOLN: 40.0000 mg | SUBCUTANEOUS | Status: DC

## 2015-10-26 MED ORDER — VANCOMYCIN HCL IN DEXTROSE 1-5 GM/200ML-% IV SOLN
1000.0000 mg | Freq: Once | INTRAVENOUS | Status: AC
  Administered 2015-10-26: 1000 mg via INTRAVENOUS
  Filled 2015-10-26: qty 200

## 2015-10-26 MED ORDER — HYDROCHLOROTHIAZIDE 25 MG PO TABS
25.0000 mg | ORAL_TABLET | Freq: Every day | ORAL | Status: DC
  Administered 2015-10-28: 25 mg via ORAL
  Filled 2015-10-26 (×3): qty 1

## 2015-10-26 MED ORDER — SODIUM CHLORIDE 0.9 % IV SOLN: INTRAVENOUS | Status: DC

## 2015-10-26 MED ORDER — METOPROLOL TARTRATE 25 MG PO TABS
25.0000 mg | ORAL_TABLET | Freq: Two times a day (BID) | ORAL | Status: DC
  Administered 2015-10-27 – 2015-10-28 (×3): 25 mg via ORAL
  Filled 2015-10-26 (×5): qty 1

## 2015-10-26 MED ORDER — ENOXAPARIN SODIUM 40 MG/0.4ML ~~LOC~~ SOLN
40.0000 mg | SUBCUTANEOUS | Status: DC
  Administered 2015-10-27: 40 mg via SUBCUTANEOUS
  Filled 2015-10-26: qty 0.4

## 2015-10-26 MED ORDER — ACETAMINOPHEN 650 MG RE SUPP
975.0000 mg | Freq: Once | RECTAL | Status: AC
  Administered 2015-10-26: 975 mg via RECTAL
  Filled 2015-10-26: qty 1

## 2015-10-26 MED ORDER — STROKE: EARLY STAGES OF RECOVERY BOOK
Freq: Once | Status: AC
  Administered 2015-10-26: 23:00:00

## 2015-10-26 MED ORDER — POTASSIUM CHLORIDE IN NACL 20-0.9 MEQ/L-% IV SOLN
INTRAVENOUS | Status: DC
  Administered 2015-10-27: 01:00:00 via INTRAVENOUS
  Filled 2015-10-26 (×3): qty 1000

## 2015-10-26 MED ORDER — SODIUM CHLORIDE 0.9 % IV BOLUS (SEPSIS)
1000.0000 mL | Freq: Once | INTRAVENOUS | Status: AC
Start: 2015-10-26 — End: 2015-10-26
  Administered 2015-10-26: 1000 mL via INTRAVENOUS

## 2015-10-26 MED ORDER — DEXTROSE 5 % IV SOLN
2.0000 g | Freq: Once | INTRAVENOUS | Status: AC
  Administered 2015-10-26: 2 g via INTRAVENOUS
  Filled 2015-10-26: qty 2

## 2015-10-26 MED ORDER — SODIUM CHLORIDE 0.9 % IV SOLN
1.5000 g | Freq: Four times a day (QID) | INTRAVENOUS | Status: DC
  Administered 2015-10-27 (×3): 1.5 g via INTRAVENOUS
  Filled 2015-10-26 (×5): qty 1.5

## 2015-10-26 MED ORDER — SENNOSIDES-DOCUSATE SODIUM 8.6-50 MG PO TABS: 1.0000 | ORAL_TABLET | Freq: Every evening | ORAL | Status: DC | PRN

## 2015-10-26 MED ORDER — SODIUM CHLORIDE 0.9 % IV BOLUS (SEPSIS)
250.0000 mL | Freq: Once | INTRAVENOUS | Status: AC
  Administered 2015-10-26: 250 mL via INTRAVENOUS

## 2015-10-26 MED ORDER — HYDROCODONE-ACETAMINOPHEN 5-325 MG PO TABS
1.0000 | ORAL_TABLET | ORAL | Status: DC | PRN
  Administered 2015-10-27: 2 via ORAL
  Filled 2015-10-26: qty 2

## 2015-10-26 MED ORDER — LAMOTRIGINE 25 MG PO TABS
50.0000 mg | ORAL_TABLET | Freq: Every day | ORAL | Status: DC
  Administered 2015-10-27 (×2): 50 mg via ORAL
  Filled 2015-10-26 (×2): qty 2

## 2015-10-26 MED ORDER — POLYETHYLENE GLYCOL 3350 17 G PO PACK: 17.0000 g | PACK | Freq: Every day | ORAL | Status: DC | PRN

## 2015-10-26 NOTE — ED Notes (Signed)
Pt from [facility] via EMS, reports fever, pt altered from baseline per staff
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
## 2015-10-26 NOTE — ED Notes (Signed)
TARA FROM [facility] CALLED BACK AT 8:51PM AND I GAVE HER THE INFO FOR THE CODE SEPSIS TO BE ACTIVATED.

## 2015-10-26 NOTE — Progress Notes (Signed)
Pharmacy Antibiotic Note  Vernon Hayes is a 70 y.o. male admitted on 10/26/2015 with pneumonia.  Pharmacy has been consulted for cefepime and vancomycin dosing.  Plan: 1. Cefepime 2 gm IV Q12H 2. Vancomycin 1 gm IV x 1 in ED followed in approximately 9 hours (stacked dosing) by 1 gm IV Q18H, predicted trough 18 mcg/mL. Pharmacy will continue to follow and adjust as needed to maintain trough 15 to 20 mcg/mL.   Vd 50 L, Ke 0.042 hr-1, T1/2 16.3 hr  Weight: 153 lb (69.4 kg)  Temp (24hrs), Avg:100.7 F (38.2 C), Min:100.7 F (38.2 C), Max:100.7 F (38.2 C)   Recent Labs Lab 10/26/15 2004  WBC 7.2  CREATININE 1.45*  LATICACIDVEN 1.1    Estimated Creatinine Clearance: 45.9 mL/min (by C-G formula based on Cr of 1.45).    Allergies  Allergen Reactions  . Tetracyclines & Related Hives    Thank you for allowing pharmacy to be a part of this patient's care.  Laural Benes, Pharm.D., BCPS Clinical Pharmacist 10/26/2015 10:58 PM

## 2015-10-26 NOTE — ED Notes (Signed)
Patient transported to X-ray 

## 2015-10-26 NOTE — ED Notes (Signed)
ATTEMPTED TO CALL CODE SEPSIS AT 8:27PM AND SPOKE WITH TARA AT [facility]. SHE STATED THAT SHE WILL HAVE TO CALL BACK BECAUSE THEY WERE DEALING WITH A STEMI.

## 2015-10-26 NOTE — ED Notes (Signed)
Per MD orders, pt taken off 2L oxygen. Pt tolerating well. o2 100%

## 2015-10-27 ENCOUNTER — Observation Stay: Payer: Medicare Other

## 2015-10-27 LAB — LIPID PANEL
CHOL/HDL RATIO: 3.3 ratio
Cholesterol: 132 mg/dL (ref 0–200)
HDL: 40 mg/dL — AB (ref >40)
LDL CALC: 82 mg/dL (ref 0–99)
TRIGLYCERIDES: 49 mg/dL (ref <150)
VLDL: 10 mg/dL (ref 0–40)

## 2015-10-27 LAB — BASIC METABOLIC PANEL
Anion gap: 8 (ref 5–15)
BUN: 28 mg/dL — AB (ref 6–20)
CALCIUM: 10.8 mg/dL — AB (ref 8.9–10.3)
CO2: 25 mmol/L (ref 22–32)
CREATININE: 1.27 mg/dL — AB (ref 0.61–1.24)
Chloride: 107 mmol/L (ref 101–111)
GFR, EST NON AFRICAN AMERICAN: 55 mL/min — AB (ref >60)
Glucose, Bld: 100 mg/dL — ABNORMAL HIGH (ref 65–99)
Potassium: 3.3 mmol/L — ABNORMAL LOW (ref 3.5–5.1)
SODIUM: 140 mmol/L (ref 135–145)

## 2015-10-27 LAB — GLUCOSE, CAPILLARY
GLUCOSE-CAPILLARY: 57 mg/dL — AB (ref 65–99)
GLUCOSE-CAPILLARY: 57 mg/dL — AB (ref 65–99)
GLUCOSE-CAPILLARY: 70 mg/dL (ref 65–99)
GLUCOSE-CAPILLARY: 76 mg/dL (ref 65–99)
Glucose-Capillary: 101 mg/dL — ABNORMAL HIGH (ref 65–99)
Glucose-Capillary: 37 mg/dL — CL (ref 65–99)
Glucose-Capillary: 62 mg/dL — ABNORMAL LOW (ref 65–99)
Glucose-Capillary: 90 mg/dL (ref 65–99)

## 2015-10-27 LAB — TSH: TSH: 1.911 u[IU]/mL (ref 0.350–4.500)

## 2015-10-27 LAB — CBC
HEMATOCRIT: 32.4 % — AB (ref 40.0–52.0)
Hemoglobin: 11.3 g/dL — ABNORMAL LOW (ref 13.0–18.0)
MCH: 34.5 pg — AB (ref 26.0–34.0)
MCHC: 34.7 g/dL (ref 32.0–36.0)
MCV: 99.3 fL (ref 80.0–100.0)
PLATELETS: 176 10*3/uL (ref 150–440)
RBC: 3.27 MIL/uL — ABNORMAL LOW (ref 4.40–5.90)
RDW: 13.4 % (ref 11.5–14.5)
WBC: 5.8 10*3/uL (ref 3.8–10.6)

## 2015-10-27 LAB — LACTIC ACID, PLASMA: Lactic Acid, Venous: 1.3 mmol/L (ref 0.5–2.0)

## 2015-10-27 LAB — HEMOGLOBIN A1C: Hgb A1c MFr Bld: 5.4 % (ref 4.0–6.0)

## 2015-10-27 LAB — MRSA PCR SCREENING: MRSA BY PCR: NEGATIVE

## 2015-10-27 MED ORDER — DEXTROSE 50 % IV SOLN
1.0000 | INTRAVENOUS | Status: AC
  Administered 2015-10-27: 50 mL via INTRAVENOUS
  Filled 2015-10-27: qty 50

## 2015-10-27 MED ORDER — ACETAMINOPHEN 325 MG PO TABS: 650.0000 mg | ORAL_TABLET | Freq: Four times a day (QID) | ORAL | Status: DC | PRN

## 2015-10-27 MED ORDER — ENOXAPARIN SODIUM 40 MG/0.4ML ~~LOC~~ SOLN
40.0000 mg | SUBCUTANEOUS | Status: DC
  Filled 2015-10-27: qty 0.4
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
## 2015-10-27 NOTE — Care Management Obs Status (Signed)
MEDICARE OBSERVATION STATUS NOTIFICATION   Patient Details  Name: Vernon Hayes MRN: [id number] Date of Birth: 07/28/1945   Medicare Observation Status Notification Given:       Jolly Mango, RN 10/27/2015, 9:02 AM

## 2015-10-27 NOTE — Clinical Social Work Note (Signed)
Clinical Social Work Assessment  Patient Details  Name: Vernon Hayes MRN: [id number] Date of Birth: Jun 29, 1945  Date of referral:  10/27/15               Reason for consult:  Discharge Planning                Permission sought to share information with:  Family Supports Permission granted to share information::     Name::        Agency::     Relationship::   Devan Danzer- [phone])  Contact Information:     Housing/Transportation Living arrangements for the past 2 months:  [place] of Information:  Patient Patient Interpreter Needed:  None Criminal Activity/Legal Involvement Pertinent to Current Situation/Hospitalization:  No - Comment as needed Significant Relationships:  Other Family Members Lives with:  Facility Resident Do you feel safe going back to the place where you live?  Yes Need for family participation in patient care:  No (Coment)  Care giving concerns:  Patient was in [place] at [facility].    Social Worker assessment / plan:  CSW met with patient at bedside. Introduced herself and her role. Per patient he's been at [facility] for the past 3 weeks for STR. Stated that he'd like to return there at discharge in order to continue receiving STR. Patient granted CSW verbal permission to coordinate discharge with [facility]. Granted CSW verbal permission to contact Patrick Salemi if additional information is needed. FL2/ PASRR completed and placed in MD's basket for co-sign. CSW contacted [place] in admissions at [facility] she reports that she can accept patient back. CSW will continue to follow and assist.   Employment status:  Retired Forensic scientist:  Medicare PT Recommendations:  Not assessed at this time Information / Referral to community resources:     Patient/Family's Response to care:  Patient is in agreement to return to [facility] at discharge.   Patient/Family's Understanding of and Emotional Response to Diagnosis, Current Treatment, and Prognosis:   Patient reports that he understands why he was admitted into [facility] and is appreciates CSW's assistance.   Emotional Assessment Appearance:  Appears stated age Attitude/Demeanor/Rapport:   (None) Affect (typically observed):  Calm, Pleasant, Accepting Orientation:  Oriented to Self, Oriented to Place, Oriented to  Time, Oriented to Situation Alcohol / Substance use:  Not Applicable Psych involvement (Current and /or in the community):  No (Comment)  Discharge Needs  Concerns to be addressed:  Discharge Planning Concerns Readmission within the last 30 days:  No Current discharge risk:  Chronically ill Barriers to Discharge:  Continued Medical Work up   [employer], LCSW 10/27/2015, 3:41 PM

## 2015-10-27 NOTE — Progress Notes (Signed)
Neuro checks are stable when pt allows for it to be done. Vernon Hayes. Room air. Speech therapy advanced diet. Pt reports no pain. FS are stable. Pt has no further concerns at this time.

## 2015-10-27 NOTE — Progress Notes (Signed)
PT Cancellation Note  Patient Details Name: RONIN REHFELDT MRN: [id number] DOB: 07/09/1945   Cancelled Treatment:    Reason Eval/Treat Not Completed: Patient declined, no reason specified. PT attempted mobility evaluation, patient is lethargic and declines any attempt at mobility. PT will re-attempt at a later time/date.   Kerman Passey, PT, DPT    10/27/2015, 4:57 PM

## 2015-10-27 NOTE — Plan of Care (Signed)
Problem: Physical Regulation: Goal: Diagnostic test results will improve Outcome: Not Progressing Patient's potassium is 3.3, patient is currently getting 88mq at 1079mhr. Will continue to monitor

## 2015-10-27 NOTE — Progress Notes (Signed)
PT Cancellation Note  Patient Details Name: Vernon Hayes MRN: [id number] DOB: 05-02-1946   Cancelled Treatment:     Patient is currently off the floor for swallow study and is unavailable for mobility evaluation. PT will re-attempt at a later time/date as patient is appropriate and available.   Kerman Passey, PT, DPT    10/27/2015, 1:59 PM

## 2015-10-27 NOTE — Plan of Care (Signed)
Problem: Fluid Volume: Goal: Hemodynamic stability will improve Outcome: Completed/Met Date Met:  10/27/15 Patient VS is stable. BS dropped to 56, OJ given now BS 70. Oncoming nurse is aware.

## 2015-10-27 NOTE — NC FL2 (Addendum)
[place] LEVEL OF CARE SCREENING TOOL     IDENTIFICATION  Patient Name: Vernon Hayes Birthdate: Nov 21, 1945 Sex: male Admission Date (Current Location): 10/26/2015  [place] and [place] Number:  Engineering geologist and Address:  [facility], [address], [place], [place] [address]      Provider Number: [id number]  Attending Physician Name and Address:  Hillary Bow, MD  Relative Name and Phone Number:       Current Level of Care: Hospital Recommended Level of Care: [place] Prior Approval Number:    Date Approved/Denied:   PASRR Number:  [id number])  Discharge Plan: SNF    Current Diagnoses: Patient Active Problem List   Diagnosis Date Noted  . Altered mental status 10/26/2015  . Hypercalcemia 09/25/2015  . Vertigo 09/23/2015  . 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 Malnutrition of moderate degree ([place]) 01/22/2015  . Lung mass 01/19/2015  . Pre-operative cardiovascular examination 01/12/2015  . Essential hypertension 01/12/2015  . Abnormal PET scan of lung     Orientation RESPIRATION BLADDER Height & Weight     Self, Place  Normal Incontinent Weight: 142 lb 8 oz (64.638 kg) Height:     BEHAVIORAL SYMPTOMS/MOOD NEUROLOGICAL BOWEL NUTRITION STATUS   (None)  (None) Incontinent Diet DYS 1- Honey Thick   AMBULATORY STATUS COMMUNICATION OF NEEDS Skin   Extensive Assist Verbally Normal                       Personal Care Assistance Level of Assistance  Bathing, Feeding, Dressing Bathing Assistance: Limited assistance Feeding assistance: Independent Dressing Assistance: Limited assistance     Functional Limitations Info  Sight, Hearing, Speech Sight Info: Adequate Hearing Info: Adequate Speech Info: Adequate    SPECIAL CARE FACTORS FREQUENCY  PT (By licensed PT)   ST (By licensed ST)     PT Frequency:  (5)     ST Frequency:  (5)          Contractures      Additional Factors Info   Code Status, Allergies Code Status Info:  (Full Code) Allergies Info:  (Tetracyclines & Related)           Current Medications (10/27/2015):  This is the current hospital active medication list Current Facility-Administered Medications  Medication Dose Route Frequency Provider Last Rate Last Dose  . 0.9 % NaCl with KCl 20 mEq/ L  infusion   Intravenous Continuous Alesia Richards, MD 100 mL/hr at 10/27/15 0055    . ampicillin-sulbactam (UNASYN) 1.5 g in sodium chloride 0.9 % 50 mL IVPB  1.5 g Intravenous Q6H Alesia Richards, MD   1.5 g at 10/27/15 0803  . enoxaparin (LOVENOX) injection 40 mg  40 mg Subcutaneous Q24H Alesia Richards, MD   40 mg at 10/27/15 0046  . hydrochlorothiazide (HYDRODIURIL) tablet 25 mg  25 mg Oral Daily Alesia Richards, MD      . HYDROcodone-acetaminophen (NORCO/VICODIN) 5-325 MG per tablet 1-2 tablet  1-2 tablet Oral Q4H PRN Alesia Richards, MD   2 tablet at 10/27/15 [phone]  . lamoTRIgine (LAMICTAL) tablet 50 mg  50 mg Oral QHS Alesia Richards, MD   50 mg at 10/27/15 0047  . metoprolol tartrate (LOPRESSOR) tablet 25 mg  25 mg Oral BID Alesia Richards, MD   25 mg at 10/27/15 0047  . polyethylene glycol (MIRALAX / GLYCOLAX) packet 17 g  17 g Oral Daily PRN Alesia Richards, MD      . senna-docusate (Senokot-S)  tablet 1 tablet  1 tablet Oral QHS PRN Alesia Richards, MD         Discharge Medications: Please see discharge summary for a list of discharge medications.  Relevant Imaging Results:  Relevant Lab Results:   Additional Information  (SSN [id number])  Lorenso Quarry Ramsay Bognar, LCSW

## 2015-10-27 NOTE — Progress Notes (Signed)
[facility] [place] NAME: Vernon Hayes    MR#:  [id number]  DATE OF BIRTH:  1946/01/07  SUBJECTIVE:  CHIEF COMPLAINT:   Chief Complaint  Patient presents with  . Code Sepsis   Patient feels well. Not needing oxygen. Afebrile.  REVIEW OF SYSTEMS:    Review of Systems  Unable to perform ROS: dementia    DRUG ALLERGIES:   Allergies  Allergen Reactions  . Tetracyclines & Related Hives    VITALS:  Blood pressure 118/79, pulse 49, temperature 97.9 F (36.6 C), temperature source Axillary, resp. rate 18, weight 64.638 kg (142 lb 8 oz), SpO2 97 %.  PHYSICAL EXAMINATION:   Physical Exam  GENERAL:  70 y.o.-year-old patient lying in the bed with no acute distress.  EYES: Pupils equal, round, reactive to light and accommodation. No scleral icterus. Extraocular muscles intact.  HEENT: Head atraumatic, normocephalic. Oropharynx and nasopharynx clear.  NECK:  Supple, no jugular venous distention. No thyroid enlargement, no tenderness.  LUNGS: Normal work of breathing. Coarse breath sounds bilaterally  CARDIOVASCULAR: S1, S2 normal. No murmurs, rubs, or gallops.  ABDOMEN: Soft, nontender, nondistended. Bowel sounds present. No organomegaly or mass.  EXTREMITIES: No cyanosis, clubbing or edema b/l.    NEUROLOGIC: Moves all 4 extremities PSYCHIATRIC: The patient is Awake. Pleasantly confused SKIN: No obvious rash, lesion, or ulcer.   LABORATORY PANEL:   CBC  Recent Labs Lab 10/27/15 0124  WBC 5.8  HGB 11.3*  HCT 32.4*  PLT 176   ------------------------------------------------------------------------------------------------------------------ Chemistries   Recent Labs Lab 10/26/15 2004 10/27/15 0124  NA 137 140  K 3.4* 3.3*  CL 100* 107  CO2 27 25  GLUCOSE 95 100*  BUN 29* 28*  CREATININE 1.45* 1.27*  CALCIUM 12.2* 10.8*  AST 14*  --   ALT 7*  --   ALKPHOS 50  --   BILITOT 1.1  --     ------------------------------------------------------------------------------------------------------------------  Cardiac Enzymes  Recent Labs Lab 10/26/15 2004  TROPONINI <0.03   ------------------------------------------------------------------------------------------------------------------  RADIOLOGY:  Dg Chest 2 View  10/27/2015  CLINICAL DATA:  Confusion and fever. Status post admission to the hospital yesterday. Recent fall. Initial encounter. EXAM: CHEST  2 VIEW COMPARISON:  PA and lateral chest 10/26/2015. Single view of the chest 09/25/2015. CT chest 06/25/2015. FINDINGS: Port-A-Cath remains in place. The lungs are emphysematous. Right upper lobe pulmonary nodule is better seen on the prior CT. No pneumothorax or pleural effusion. Aortic atherosclerosis is noted. Heart size is upper normal. There has been marked progression of bony destructive change in the distal clavicle, scapula and left humeral head since the most recent examination. IMPRESSION: No acute disease. Left upper lobe pulmonary nodule is less conspicuous than on the prior CT. Marked progression of bony destructive change about the left shoulder likely secondary to metastatic carcinoma. Electronically Signed   By: Inge Rise M.D.   On: 10/27/2015 08:57   Dg Chest 2 View  10/26/2015  CLINICAL DATA:  Fever and cough. EXAM: CHEST  2 VIEW COMPARISON:  09/25/2015 FINDINGS: RIGHT power port. Normal cardiac silhouette. No effusion, infiltrate pneumothorax. Degenerative osteophytosis of the thoracic spine. Lytic lesion in the proximal LEFT humerus and distal LEFT clavicle again noted IMPRESSION: No acute cardiopulmonary process. Lytic lesions of the distal LEFT clavicle and humerus again noted. Electronically Signed   By: Suzy Bouchard M.D.   On: 10/26/2015 20:52   Ct Head Wo Contrast  10/26/2015  CLINICAL DATA:  Altered mental  status and fever. EXAM: CT HEAD WITHOUT CONTRAST TECHNIQUE: Contiguous axial images were  obtained from the base of the skull through the vertex without intravenous contrast. COMPARISON:  09/23/2015 FINDINGS: Small, chronic infarcts are again seen in the right cerebellum and thalami. Confluent hypodensities in the cerebral white matter are unchanged and nonspecific but compatible with moderate to severe chronic small vessel ischemic disease. There is no evidence of acute cortical infarct, intracranial hemorrhage, mass, midline shift, or extra-axial fluid collection. Mild to moderate cerebral atrophy is unchanged. Orbits are unremarkable. Right parieto-occipital scalp hematoma on the prior CT has resolved. Trace paranasal sinus mucosal thickening is noted. No skull fracture is identified. Calcified atherosclerosis is noted at the skull base. IMPRESSION: 1. No evidence of acute intracranial abnormality. 2. Moderate to severe chronic small vessel ischemic disease. Electronically Signed   By: Logan Bores M.D.   On: 10/26/2015 21:06   Mr Brain Wo Contrast  10/27/2015  CLINICAL DATA:  70 year old male with altered mental status. Underlying dementia. Personal history of lung cancer status post lobectomy. EXAM: MRI HEAD WITHOUT CONTRAST TECHNIQUE: Multiplanar, multiecho pulse sequences of the brain and surrounding structures were obtained without intravenous contrast. COMPARISON:  Head CT without contrast 10/26/2015. Brain MRI 07/14/2015. FINDINGS: Major intracranial vascular flow voids are stable. No restricted diffusion or evidence of acute infarction. Chronic confluent cerebral white matter and widespread deep gray matter T2 heterogeneity. Chronic lacunar infarcts in the thalami and posterior deep white matter capsules. Chronic lacunar infarcts in the brainstem and occasionally the cerebellum. Scattered chronic micro hemorrhages throughout the brain, most concentrated about the deep gray matter and periventricular white matter. Stable gray and white matter signal throughout the brain. Stable cerebral  volume. No midline shift, mass effect, evidence of mass lesion, ventriculomegaly, extra-axial collection or acute intracranial hemorrhage. Cervicomedullary junction and pituitary are within normal limits. Stable paranasal sinuses and mastoids. Negative orbit and scalp soft tissues. Normal bone marrow signal. Negative visualized cervical spine. IMPRESSION: No acute intracranial abnormality and stable MRI appearance of the brain since March with advanced chronic small vessel disease. Electronically Signed   By: Genevie Ann M.D.   On: 10/27/2015 10:15   US Carotid Bilateral  10/27/2015  CLINICAL DATA:  Dysphagia, cerebrovascular accident. EXAM: BILATERAL CAROTID DUPLEX ULTRASOUND TECHNIQUE: Pearline Cables scale imaging, color Doppler and duplex ultrasound were performed of bilateral carotid and vertebral arteries in the neck. COMPARISON:  None. FINDINGS: Criteria: Quantification of carotid stenosis is based on velocity parameters that correlate the residual internal carotid diameter with NASCET-based stenosis levels, using the diameter of the distal internal carotid lumen as the denominator for stenosis measurement. The following velocity measurements were obtained: RIGHT ICA:  66/19 cm/sec CCA:  61/4 cm/sec SYSTOLIC ICA/CCA RATIO:  1.2 DIASTOLIC ICA/CCA RATIO:  2.2 ECA:  56 cm/sec LEFT ICA:  64/22 cm/sec CCA:  43/15 cm/sec SYSTOLIC ICA/CCA RATIO:  1.0 DIASTOLIC ICA/CCA RATIO:  2.3 ECA:  45 cm/sec RIGHT CAROTID ARTERY: Mild irregular plaque formation is noted in the proximal right internal carotid artery consistent with less than 50% diameter stenosis based on ultrasound and Doppler criteria. RIGHT VERTEBRAL ARTERY:  Antegrade flow is noted. LEFT CAROTID ARTERY: Moderate eccentric calcified plaque formation is noted in the proximal left internal carotid artery consistent with less than 50% diameter stenosis based on ultrasound and Doppler criteria. LEFT VERTEBRAL ARTERY:  Antegrade flow is noted. IMPRESSION: Mild irregular plaque  formation is noted in the proximal right internal carotid artery consistent with less than 50% diameter stenosis based on  ultrasound and Doppler criteria. Moderate eccentric calcified plaque formation is noted in the proximal left internal carotid artery consistent with less than 50% diameter stenosis based on ultrasound and Doppler criteria. Electronically Signed   By: Marijo Conception, M.D.   On: 10/27/2015 09:58     ASSESSMENT AND PLAN:   70 year old male admitted for altered mental status, concern for aspiration pneumonia.  1. Dementia with delirium Patient did have temperature 100.7 on arrival. Concern for aspiration pneumonia. No infiltrates on chest x-ray. Presently afebrile. Stop antibiotics. Monitor for another 24 hours in the hospital. If no further fever and not needing oxygen can be discharged back to nursing home. MRI of the brain showed nothing acute.  2. Dysphagia.  Likely due to worsening dementia Patient is high risk for aspiration. Had modified barium swallow study today. Marland Kitchen Refer to speech therapy notes. On dysphagia diet.  3. Hypokalemia. Repleted  4. Hypercalcemia. History of lung cancer  5. Hypertension.  On hydrochlorothiazide and metoprolol  6. DVT prophylaxis. Lovenox 40 mg daily.  All the records are reviewed and case discussed with Care Management/Social Workerr. Management plans discussed with the patient, family and they are in agreement.  CODE STATUS: FULL CODE  DVT Prophylaxis: SCDs  TOTAL TIME TAKING CARE OF THIS PATIENT: 35 minutes.   Discharge tomorrow back to nursing home if no fevers and not needing oxygen.  Hillary Bow R M.D on 10/27/2015 at 3:58 PM  Between 7am to 6pm - Pager - [phone]  After 6pm go to [email or web address] - password [email or web address] [facility]  Office  [phone]  CC: Primary care physician; Cletis Athens, MD  Note: This dictation was prepared with Dragon dictation along with smaller phrase  technology. Any transcriptional errors that result from this process are unintentional.
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
## 2015-10-27 NOTE — Progress Notes (Signed)
OT Cancellation Note  Patient Details Name: ISACC TURNEY MRN: [id number] DOB: [birth year]   Cancelled Treatment:     Attempted OT evaluation this afternoon however, patient appears agitated and not cooperative for evaluation. He states he lives "here" and was able to state this was the hospital.  Patient refused additional questions.  Will reattempt to see if patient is appropriate for OT next date. Amy T Lovett, OTR/L, CLT  Lovett,Amy 10/27/2015, 3:26 PM

## 2015-10-27 NOTE — Progress Notes (Signed)
Initial Nutrition Assessment  DOCUMENTATION CODES:   Severe malnutrition in context of chronic illness  INTERVENTION:   Await diet advancement as medically able. RD notes pt scheduled for MBSS this afternoon. Will follow and make recommendations accordingly.   NUTRITION DIAGNOSIS:   Malnutrition related to chronic illness as evidenced by severe depletion of muscle mass, severe depletion of body fat, percent weight loss.  GOAL:   Patient will meet greater than or equal to 90% of their needs  MONITOR:   PO intake, Labs, Weight trends, I & O's, Diet advancement  REASON FOR ASSESSMENT:   Malnutrition Screening Tool    ASSESSMENT:   Pt admitted with AMS with h/o dementia, prior EtOH use, squamous cell carcinoma of lung s/p right lobectomy. Pt from [facility] per chart review.   Past Medical History  Diagnosis Date  . GERD (gastroesophageal reflux disease)   . Heart attack ([place]) 1982  . Hypertension   . 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 Squamous cell carcinoma of right lung ([place]) 12/28/14    Wedge resection on 01/19/15, pathology reports 1.2 cm squamous cell carcinoma with negative margins  . History of chemotherapy     Completed cycle 2 chemo with Carboplatin/5FU on 12/26/11.  Marland Kitchen History of radiation therapy   . Vertigo   . Loss of appetite   . ETOH abuse     Diet Order:  Diet NPO time specified   Pt NPO since admission, awaiting swallow study.   Pt grumbled when asked about appetite. Unable to clarify po intake PTA. RD notes on last admission, last month pt reported poor appetite then.  Pt on Dysphagia II diet order on last admission last month with Ensure BID.    Medications: NS with KCl at 118m/hr Labs: K 3.3, Ca 10.8   Gastrointestinal Profile: Last BM:  10/27/2015   Nutrition-Focused Physical Exam Findings: Nutrition-Focused physical exam completed. Findings  are moderate-severe fat depletion, moderate-severe muscle depletion, and no edema.    Weight Change: Per chart review pt with 7% weight loss in one month since last admission.   Skin:  Reviewed, no issues   Height:   Ht Readings from Last 1 Encounters:  09/27/15 6' (1.829 m)    Weight:   Wt Readings from Last 1 Encounters:  10/27/15 142 lb 8 oz (64.638 kg)   Wt Readings from Last 10 Encounters:  10/27/15 142 lb 8 oz (64.638 kg)  09/28/15 152 lb 6.4 oz (69.128 kg)  09/23/15 140 lb (63.504 kg)  06/28/15 151 lb 14.4 oz (68.9 kg)  03/24/15 149 lb 7.6 oz (67.8 kg)  02/11/15 156 lb (70.761 kg)  02/01/15 156 lb 6.7 oz (70.95 kg)  01/25/15 152 lb 8.9 oz (69.199 kg)  01/19/15 153 lb (69.4 kg)  01/15/15 153 lb 3.2 oz (69.491 kg)     BMI:  Body mass index is 19.32 kg/(m^2).  Estimated Nutritional Needs:   Kcal:  1775-2098kcals  Protein:  72-85g protein  Fluid:  >/=1.8L fluid  EDUCATION NEEDS:   Education needs no appropriate at this time   ADwyane Luo RD, LDN Pager ([phone]/On-Call Pager ([phone]

## 2015-10-27 NOTE — Therapy (Signed)
Attempted to instruct patient on Incentive Spirometry. Patient refused at this time. IS left at bedside, nurse informed

## 2015-10-27 NOTE — Care Management (Signed)
Patient has been placed in observation from  health care center for altered mental status. Placed CSW consult.  Brain mri is pending

## 2015-10-28 DIAGNOSIS — E43 Unspecified severe protein-calorie malnutrition: Secondary | ICD-10-CM | POA: Insufficient documentation

## 2015-10-28 LAB — GLUCOSE, CAPILLARY
GLUCOSE-CAPILLARY: 72 mg/dL (ref 65–99)
GLUCOSE-CAPILLARY: 58 mg/dL — AB (ref 65–99)
Glucose-Capillary: 77 mg/dL (ref 65–99)
Glucose-Capillary: 100 mg/dL — ABNORMAL HIGH (ref 65–99)

## 2015-10-28 LAB — URINE CULTURE: CULTURE: NO GROWTH

## 2015-10-28 MED ORDER — METOPROLOL TARTRATE 25 MG PO TABS: 25.0000 mg | ORAL_TABLET | Freq: Two times a day (BID) | ORAL | Status: DC

## 2015-10-28 NOTE — Care Management (Addendum)
Anticipate discharge back to [facility] today.  Patient is going to require 100% supervision with feedings - Honey thick liquids.  Will benefit from slp following when returns to skilled nursing .  Patient was on regular diet at the facility.  He is at high risk for aspiration. Discharge orders will need to include an actual order for blood sugar checks, so blood sugars can be followed at the facility

## 2015-10-28 NOTE — Progress Notes (Signed)
Clinical Social Worker was informed that patient will be medically ready to discharge to [facility]. Patient in a agreement with plan. CSW called [place]- in Admissions at [facility] to confirm that patient's bed is ready. Provided patient's room number 34b and number to call for report [phone] . All discharge information faxed to SNF via [facility]. Call to Larey Dresser, left message to inform him patient would discharge to [facility]. RN will call report and patient will discharge to [facility] via EMS.  Ernest Pine, MSW, [place], [place] Clinical Social Worker [phone]

## 2015-10-28 NOTE — Evaluation (Signed)
Physical Therapy Evaluation Patient Details Name: Vernon Hayes MRN: [id number] DOB: Dec 02, 1945 Today's Date: 10/28/2015   History of Present Illness  "Vernon Hayes" Clopper is a 70yo black male who comes to [facility] from [facility] with several days of AMS.Pt has advanced dementia and has been intermittently combative with nursing staff. PMH: dementia, ETOH abuse, CAD s/p MI, Lung CA s/p lobectomy. Pt was here 1MA, admitted with Hypercalcemia, but presenting with Left shoulder pain and imagining revealing bony L AC joint changes and questionable findings of the L humeral head, negative for fracture. The patient has been at [facility] for several weeks receiving PT after frequent falls PTA. SLP has seen patien tthis admission and noted S/Sx aspiration with testing, and is now NPO.     Clinical Impression  Upon entry, the patient is received semirecumbent in bed. The pt is awake and agreeable to participate. No acute distress noted at this time. The pt is alert and oriented to self, pleasant, conversational (albeit sometimes responding illogically/off topic), and following simple commands 25% with additional time required, and heavy verbal cues to remain on task once it has been begun. Strength/MMT screening reveals 5/5 strength in LUE and BLE, with focal impairment in the Left UE (painful) and minimal generalized L hemiplegia. Functional mobility assessment demonstrates moderate weakness, the pt now requiring Mod-assist physical assistance for bed mobility and transfers, and gait is limited yb balance impairment and falls anxiety. Patient presenting with impairment of strength, range of motion, balance, AMS, and activity tolerance, limiting ability to perform ADL and mobility tasks at  baseline level of function. Patient will benefit from skilled intervention to address the above impairments and limitations, in order to restore to prior level of function, improve patient safety upon discharge, and to decrease  falls risk.       Follow Up Recommendations  ([facility] )    Equipment Recommendations  None recommended by PT    Recommendations for Other Services       Precautions / Restrictions Precautions Precautions: Fall Precaution Comments: NPO, dementia c AMS      Mobility  Bed Mobility Overal bed mobility: Needs Assistance Bed Mobility: Supine to Sit;Sit to Supine     Supine to sit: Min assist Sit to supine: Mod assist   General bed mobility comments: requires repetead VC and coaxing to perform, some physical assistance needed due to confusion and/or weakness.   Transfers Overall transfer level: Needs assistance Equipment used: None Transfers: Sit to/from Stand Sit to Stand: Supervision            Ambulation/Gait Ambulation/Gait assistance: Min assist Ambulation Distance (Feet): 15 Feet Assistive device: None       General Gait Details: LLE is abducted and externally rotated, pt seems apraxic, but more likely reluctant to walk due to perceived unsteadiness. A RW is given, however pt is unable to use LUE for AD, adn attempts to use the RW in a manner that is illogical and perhaps completely unhelpful in improving gait.   Stairs            Wheelchair Mobility    Modified Rankin (Stroke Patients Only)       Balance Overall balance assessment: Needs assistance;History of Falls   Sitting balance-Leahy Scale: Good     Standing balance support: Single extremity supported;During functional activity Standing balance-Leahy Scale: Fair  Pertinent Vitals/Pain Pain Assessment: Faces Faces Pain Scale: Hurts whole lot Pain Location: unclear, but screaming when given upper trunk assistance with bed scooting.  Pain Intervention(s): Monitored during session    Home Living Family/patient expects to be discharged to:: Other (Comment)                 Additional Comments:  Dementia precludes such an expectation; likely return to [facility] to continue with treatment plan.     Prior Function Level of Independence: Needs assistance   Gait / Transfers Assistance Needed: Pt unable to give details; reports he still does not AMB at his previous baseline.            Hand Dominance   Dominant Hand: Left    Extremity/Trunk Assessment   Upper Extremity Assessment: LUE deficits/detail       LUE Deficits / Details: decreased grip strength, unable to test elbow flexion/extension due to shoulder pain response.    Lower Extremity Assessment: LLE deficits/detail   LLE Deficits / Details: Knee extension is 5/5 but liimited in range and weaker than R side.      Communication   Communication: Receptive difficulties (some intermittent receptive difficultiies; does best with brief commands and shor tquestions. )  Cognition Arousal/Alertness: Awake/alert Behavior During Therapy: Flat affect Overall Cognitive Status: Difficult to assess Area of Impairment: Attention;Orientation;Following commands Orientation Level: Person Current Attention Level: Divided;Alternating   Following Commands: Follows one step commands with increased time;Follows one step commands inconsistently (25% of time. )            General Comments      Exercises        Assessment/Plan    PT Assessment Patient needs continued PT services  PT Diagnosis Difficulty walking;Abnormality of gait;Altered mental status;Generalized weakness   PT Problem List Decreased strength;Decreased range of motion;Decreased activity tolerance;Decreased balance;Decreased mobility;Pain;Decreased safety awareness;Decreased knowledge of precautions;Decreased knowledge of use of DME  PT Treatment Interventions DME instruction;Gait training;Cognitive remediation;Stair training;Functional mobility training;Therapeutic activities;Therapeutic exercise;Balance training   PT Goals (Current goals can be found  in the Care Plan section) Acute Rehab PT Goals PT Goal Formulation: Patient unable to participate in goal setting    Frequency Min 2X/week   Barriers to discharge        Co-evaluation               End of Session Equipment Utilized During Treatment: Gait belt Activity Tolerance: Patient tolerated treatment well Patient left: in bed;with bed alarm set Nurse Communication: Other (comment)    Functional Assessment Tool Used: Clinical Judgment  Functional Limitation: Mobility: Walking and moving around Mobility: Walking and Moving Around Current Status (E1740): At least 40 percent but less than 60 percent impaired, limited or restricted Mobility: Walking and Moving Around Goal Status [phone]): At least 40 percent but less than 60 percent impaired, limited or restricted    Time: 1856-3149 PT Time Calculation (min) (ACUTE ONLY): 24 min   Charges:   PT Evaluation $PT Eval High Complexity: 1 Procedure PT Treatments $Therapeutic Activity: 8-22 mins   PT G Codes:   PT G-Codes **NOT FOR INPATIENT CLASS** Functional Assessment Tool Used: Clinical Judgment  Functional Limitation: Mobility: Walking and moving around Mobility: Walking and Moving Around Current Status (F0263): At least 40 percent but less than 60 percent impaired, limited or restricted Mobility: Walking and Moving Around Goal Status [phone]): At least 40 percent but less than 60 percent impaired, limited or restricted    12:21 PM, 10/28/2015 [place]  Kalman Drape, PT, DPT PRN Physical Therapist - [place] License # [id number] [phone] (mobile)

## 2015-10-28 NOTE — Discharge Instructions (Addendum)
°  DIET:  Regular diet Diet Recommendations:  Dysphagia 1 w/ Honey consistency liquids; strict aspiration precautions. Meds in puree - Crushed as able. Dietician f/u for nutritional support. Monitoring at meals.   DISCHARGE CONDITION:  Fair  ACTIVITY:  Activity as tolerated  OXYGEN:  Home Oxygen: No.   Oxygen Delivery: room air  DISCHARGE LOCATION:  home   If you experience worsening of your admission symptoms, develop shortness of breath, life threatening emergency, suicidal or homicidal thoughts you must seek medical attention immediately by calling 911 or calling your MD immediately  if symptoms less severe.  You Must read complete instructions/literature along with all the possible adverse reactions/side effects for all the Medicines you take and that have been prescribed to you. Take any new Medicines after you have completely understood and accpet all the possible adverse reactions/side effects.   Please note  You were cared for by a hospitalist during your hospital stay. If you have any questions about your discharge medications or the care you received while you were in the hospital after you are discharged, you can call the unit and asked to speak with the hospitalist on call if the hospitalist that took care of you is not available. Once you are discharged, your primary care physician will handle any further medical issues. Please note that NO REFILLS for any discharge medications will be authorized once you are discharged, as it is imperative that you return to your primary care physician (or establish a relationship with a primary care physician if you do not have one) for your aftercare needs so that they can reassess your need for medications and monitor your lab values.   Accu checks Fasting and at bedtime without insulin coverage.

## 2015-10-28 NOTE — Therapy (Signed)
SLP attempted visit, however pt refused to participate or intake any trials. SLP educated on swallow, and nutritional risk as well as pt blood sugars, pt continued to refuse to intake solids and discontinued communication. No treatment provided.  Leafy Kindle, SLP

## 2015-10-28 NOTE — Progress Notes (Signed)
OT Cancellation Note  Patient Details Name: Vernon Hayes MRN: [id number] DOB: 26-Jul-1945   Cancelled Treatment:    Reason Eval/Treat Not Completed: Patient declined, no reason specified (Pt. initial eval attempted. Pt. became agitated, restless, pushed/tipped the table over, and refused to answer questions about functional history/PLOF, or participate in functional assessment. WIll continue to monitor and eval as appropriate. )   Harrel Carina, MS, OTR/L   Harrel Carina 10/28/2015, 3:13 PM

## 2015-10-28 NOTE — Discharge Summary (Signed)
[facility] NAME: Vernon Hayes    MR#:  [id number]  DATE OF BIRTH:  January 02, 1946  DATE OF ADMISSION:  10/26/2015 ADMITTING PHYSICIAN: Alesia Richards, MD  DATE OF DISCHARGE: 10/28/2015  PRIMARY CARE PHYSICIAN: MASOUD,JAVED, MD   ADMISSION DIAGNOSIS:  Acute renal insufficiency [N28.9] CVA (cerebral infarction) [I63.9] Dysphagia [R13.10] Sepsis, due to unspecified organism ([place]) [A41.9] Aspiration pneumonia, unspecified aspiration pneumonia type, unspecified laterality, unspecified part of lung ([place]) [J69.0]  DISCHARGE DIAGNOSIS:  Active Problems:   Altered mental status   Protein-calorie malnutrition, severe   SECONDARY DIAGNOSIS:   Past Medical History  Diagnosis Date  . GERD (gastroesophageal reflux disease)   . Heart attack ([place]) 1982  . Hypertension   . 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 Squamous cell carcinoma of right lung ([place]) 12/28/14    Wedge resection on 01/19/15, pathology reports 1.2 cm squamous cell carcinoma with negative margins  . History of chemotherapy     Completed cycle 2 chemo with Carboplatin/5FU on 12/26/11.  [place] History of radiation therapy   . Vertigo   . Loss of appetite   . ETOH abuse      ADMITTING HISTORY  HISTORY OF PRESENT ILLNESS:  Vernon Hayes is a 70 y.o. male with a known history of Dementia and prior alcohol abuse, squames cell carcinoma of his long status post right lobectomy, coronary artery disease status post MI apparently has been over [place] home doing rehabilitation for the past 3 weeks because of frequent falls was sent over to our facility because he's been having some what sounds like altered mental status worse over the past couple days, he reportedly has been talking less, he hasn't been quite as interactive according to medical chart. Called to discuss case with his nephew  who was unaware the patient has been transferred to hospital. Patient has underlying dementia and is not reliable. At this time he is not complaining of any pain, he answers some questions appropriately but he is confused and not aware of his location.  In the emergency room had a maximum temperature of 100.7, blood pressures been stable saturating 100% on room air. CBC revealed normal blood cell count 7.2, hemoglobin was 12.9, hematocrit 37.1 with normal platelets. Metabolic panel shows a mildly decreased potassium at 3.4. Mildly elevated BUN of 29, creatinine was 1.45. Normal renal studies 1 month ago. Patient had a chest x-ray which showed a stable lytic lesion in his left clavicle otherwise no acute consolidation or vascular congestion. Underwent a CT of his head for the altered mental status which showed no acute process, moderate to severe chronic small vessel disease. Patient had some oral suctioning because he had some gurgling and per nursing had clear sputum. Because of the low-grade temperature and concern for possible aspiration he did receive a dose of vancomycin and cefepime after blood cultures were collected. Lactic acid was normal at 1.1. He chronically has elevated serum calcium most recently at 12.2. Because of altered mental status and again concern for aspiration pneumonia hospitalist called to admit.       HOSPITAL COURSE:   70 year old male admitted for altered mental status, concern for aspiration pneumonia.  1. Dementia with delirium Patient did have temperature 100.7 on arrival. Concern for aspiration pneumonia. Fever is likely from aspiration or atelectasis. No infiltrates on chest x-ray. Presently afebrile. Stopped antibiotics. Monitor  for another 24 hours in the hospital. No fever and not needing oxygen can be discharged back to nursing home. MRI of the brain showed nothing acute.  2. Dysphagia.  Likely due to worsening dementia Patient is high risk for aspiration.  Had modified barium swallow study Started on dysphagia diet and is doing well. Continue aspiration precautions.  3. Hypokalemia. Repleted  4. Hypercalcemia. History of lung cancer  5. Hypertension.  On hydrochlorothiazide and metoprolol. Increase dose of metoprolol and hydrochlorothiazide stopped as patient can easily get dehydrated due to dementia.  6. DVT prophylaxis. Lovenox in the hospital  Discharge back to rehabilitation for further physical therapy.  CONSULTS OBTAINED:     DRUG ALLERGIES:   Allergies  Allergen Reactions  . Tetracyclines & Related Hives    DISCHARGE MEDICATIONS:   Current Discharge Medication List    CONTINUE these medications which have CHANGED   Details  metoprolol tartrate (LOPRESSOR) 25 MG tablet Take 1 tablet (25 mg total) by mouth 2 (two) times daily.      CONTINUE these medications which have NOT CHANGED   Details  docusate sodium (COLACE) 100 MG capsule Take 1 capsule (100 mg total) by mouth 2 (two) times daily. Qty: 10 capsule, Refills: 0    gabapentin (NEURONTIN) 300 MG capsule Take 1 capsule (300 mg total) by mouth 2 (two) times daily. Qty: 60 capsule, Refills: 3   Associated Diagnoses: Malignant neoplasm of base of tongue ([place]); Post-thoracotomy pain; Lung mass; Malignant neoplasm of upper lobe of right lung ([facility])    HYDROcodone-acetaminophen (NORCO/VICODIN) 5-325 MG tablet Take 1-2 tablets by mouth every 4 (four) hours as needed for moderate pain. Qty: 30 tablet, Refills: 0    lamoTRIgine (LAMICTAL) 25 MG tablet Take 50 mg by mouth at bedtime.    meclizine (ANTIVERT) 25 MG tablet Take 1 tablet (25 mg total) by mouth 3 (three) times daily as needed for dizziness. Qty: 90 tablet, Refills: 1    polyethylene glycol (MIRALAX / GLYCOLAX) packet Take 17 g by mouth daily as needed for mild constipation. Qty: 14 each, Refills: 0    aspirin EC 81 MG EC tablet Take 1 tablet (81 mg total) by mouth daily.      STOP taking these  medications     hydrochlorothiazide (HYDRODIURIL) 25 MG tablet         Today   VITAL SIGNS:  Blood pressure 148/96, pulse 79, temperature 98.3 F (36.8 C), temperature source Oral, resp. rate 18, weight 64.638 kg (142 lb 8 oz), SpO2 98 %.  I/O:   Intake/Output Summary (Last 24 hours) at 10/28/15 1308 Last data filed at 10/28/15 0900  Gross per 24 hour  Intake 1341.67 ml  Output      1 ml  Net 1340.67 ml    PHYSICAL EXAMINATION:  Physical Exam  GENERAL:  70 y.o.-year-old patient lying in the bed with no acute distress.  LUNGS: Normal breath sounds bilaterally, no wheezing, rales,rhonchi or crepitation. No use of accessory muscles of respiration.  CARDIOVASCULAR: S1, S2 normal. No murmurs, rubs, or gallops.  ABDOMEN: Soft, non-tender, non-distended. Bowel sounds present. No organomegaly or mass.  NEUROLOGIC: Moves all 4 extremities. PSYCHIATRIC: The patient is alert and awake. Pleasantly confused. SKIN: No obvious rash, lesion, or ulcer.   DATA REVIEW:   CBC  Recent Labs Lab 10/27/15 0124  WBC 5.8  HGB 11.3*  HCT 32.4*  PLT 176    Chemistries   Recent Labs Lab 10/26/15 2004 10/27/15 0124  NA 137  140  K 3.4* 3.3*  CL 100* 107  CO2 27 25  GLUCOSE 95 100*  BUN 29* 28*  CREATININE 1.45* 1.27*  CALCIUM 12.2* 10.8*  AST 14*  --   ALT 7*  --   ALKPHOS 50  --   BILITOT 1.1  --     Cardiac Enzymes  Recent Labs Lab 10/26/15 2004  TROPONINI <0.03    Microbiology Results  Results for orders placed or performed during the hospital encounter of 10/26/15  Blood Culture (routine x 2)     Status: None (Preliminary result)   Collection Time: 10/26/15  8:04 PM  Result Value Ref Range Status   Specimen Description BLOOD PORTA CATH  Final   Special Requests BOTTLES DRAWN AEROBIC AND ANAEROBIC  1CC  Final   Culture NO GROWTH 2 DAYS  Final   Report Status PENDING  Incomplete  Blood Culture (routine x 2)     Status: None (Preliminary result)   Collection  Time: 10/26/15  8:09 PM  Result Value Ref Range Status   Specimen Description BLOOD RIGHT ASSIST CONTROL  Final   Special Requests   Final    BOTTLES DRAWN AEROBIC AND ANAEROBIC  AERO 3CC ANA [place]   Culture NO GROWTH 2 DAYS  Final   Report Status PENDING  Incomplete  Urine culture     Status: None   Collection Time: 10/26/15  8:50 PM  Result Value Ref Range Status   Specimen Description URINE, RANDOM  Final   Special Requests NONE  Final   Culture NO GROWTH Performed at [facility]   Final   Report Status 10/28/2015 FINAL  Final  MRSA PCR Screening     Status: None   Collection Time: 10/27/15 12:17 AM  Result Value Ref Range Status   MRSA by PCR NEGATIVE NEGATIVE Final    Comment:        The GeneXpert MRSA Assay (FDA approved for NASAL specimens only), is one component of a comprehensive MRSA colonization surveillance program. It is not intended to diagnose MRSA infection nor to guide or monitor treatment for MRSA infections.     RADIOLOGY:  Dg Chest 2 View  10/27/2015  CLINICAL DATA:  Confusion and fever. Status post admission to the hospital yesterday. Recent fall. Initial encounter. EXAM: CHEST  2 VIEW COMPARISON:  PA and lateral chest 10/26/2015. Single view of the chest 09/25/2015. CT chest 06/25/2015. FINDINGS: Port-A-Cath remains in place. The lungs are emphysematous. Right upper lobe pulmonary nodule is better seen on the prior CT. No pneumothorax or pleural effusion. Aortic atherosclerosis is noted. Heart size is upper normal. There has been marked progression of bony destructive change in the distal clavicle, scapula and left humeral head since the most recent examination. IMPRESSION: No acute disease. Left upper lobe pulmonary nodule is less conspicuous than on the prior CT. Marked progression of bony destructive change about the left shoulder likely secondary to metastatic carcinoma. Electronically Signed   By: Inge Rise M.D.   On: 10/27/2015 08:57   Dg  Chest 2 View  10/26/2015  CLINICAL DATA:  Fever and cough. EXAM: CHEST  2 VIEW COMPARISON:  09/25/2015 FINDINGS: RIGHT power port. Normal cardiac silhouette. No effusion, infiltrate pneumothorax. Degenerative osteophytosis of the thoracic spine. Lytic lesion in the proximal LEFT humerus and distal LEFT clavicle again noted IMPRESSION: No acute cardiopulmonary process. Lytic lesions of the distal LEFT clavicle and humerus again noted. Electronically Signed   By: Helane Gunther.D.  On: 10/26/2015 20:52   Ct Head Wo Contrast  10/26/2015  CLINICAL DATA:  Altered mental status and fever. EXAM: CT HEAD WITHOUT CONTRAST TECHNIQUE: Contiguous axial images were obtained from the base of the skull through the vertex without intravenous contrast. COMPARISON:  09/23/2015 FINDINGS: Small, chronic infarcts are again seen in the right cerebellum and thalami. Confluent hypodensities in the cerebral white matter are unchanged and nonspecific but compatible with moderate to severe chronic small vessel ischemic disease. There is no evidence of acute cortical infarct, intracranial hemorrhage, mass, midline shift, or extra-axial fluid collection. Mild to moderate cerebral atrophy is unchanged. Orbits are unremarkable. Right parieto-occipital scalp hematoma on the prior CT has resolved. Trace paranasal sinus mucosal thickening is noted. No skull fracture is identified. Calcified atherosclerosis is noted at the skull base. IMPRESSION: 1. No evidence of acute intracranial abnormality. 2. Moderate to severe chronic small vessel ischemic disease. Electronically Signed   By: Logan Bores M.D.   On: 10/26/2015 21:06   Mr Brain Wo Contrast  10/27/2015  CLINICAL DATA:  70 year old male with altered mental status. Underlying dementia. Personal history of lung cancer status post lobectomy. EXAM: MRI HEAD WITHOUT CONTRAST TECHNIQUE: Multiplanar, multiecho pulse sequences of the brain and surrounding structures were obtained without  intravenous contrast. COMPARISON:  Head CT without contrast 10/26/2015. Brain MRI 07/14/2015. FINDINGS: Major intracranial vascular flow voids are stable. No restricted diffusion or evidence of acute infarction. Chronic confluent cerebral white matter and widespread deep gray matter T2 heterogeneity. Chronic lacunar infarcts in the thalami and posterior deep white matter capsules. Chronic lacunar infarcts in the brainstem and occasionally the cerebellum. Scattered chronic micro hemorrhages throughout the brain, most concentrated about the deep gray matter and periventricular white matter. Stable gray and white matter signal throughout the brain. Stable cerebral volume. No midline shift, mass effect, evidence of mass lesion, ventriculomegaly, extra-axial collection or acute intracranial hemorrhage. Cervicomedullary junction and pituitary are within normal limits. Stable paranasal sinuses and mastoids. Negative orbit and scalp soft tissues. Normal bone marrow signal. Negative visualized cervical spine. IMPRESSION: No acute intracranial abnormality and stable MRI appearance of the brain since March with advanced chronic small vessel disease. Electronically Signed   By: Genevie Ann M.D.   On: 10/27/2015 10:15   US Carotid Bilateral  10/27/2015  CLINICAL DATA:  Dysphagia, cerebrovascular accident. EXAM: BILATERAL CAROTID DUPLEX ULTRASOUND TECHNIQUE: Pearline Cables scale imaging, color Doppler and duplex ultrasound were performed of bilateral carotid and vertebral arteries in the neck. COMPARISON:  None. FINDINGS: Criteria: Quantification of carotid stenosis is based on velocity parameters that correlate the residual internal carotid diameter with NASCET-based stenosis levels, using the diameter of the distal internal carotid lumen as the denominator for stenosis measurement. The following velocity measurements were obtained: RIGHT ICA:  66/19 cm/sec CCA:  68/3 cm/sec SYSTOLIC ICA/CCA RATIO:  1.2 DIASTOLIC ICA/CCA RATIO:  2.2 ECA:   56 cm/sec LEFT ICA:  64/22 cm/sec CCA:  41/96 cm/sec SYSTOLIC ICA/CCA RATIO:  1.0 DIASTOLIC ICA/CCA RATIO:  2.3 ECA:  45 cm/sec RIGHT CAROTID ARTERY: Mild irregular plaque formation is noted in the proximal right internal carotid artery consistent with less than 50% diameter stenosis based on ultrasound and Doppler criteria. RIGHT VERTEBRAL ARTERY:  Antegrade flow is noted. LEFT CAROTID ARTERY: Moderate eccentric calcified plaque formation is noted in the proximal left internal carotid artery consistent with less than 50% diameter stenosis based on ultrasound and Doppler criteria. LEFT VERTEBRAL ARTERY:  Antegrade flow is noted. IMPRESSION: Mild irregular plaque formation is noted  in the proximal right internal carotid artery consistent with less than 50% diameter stenosis based on ultrasound and Doppler criteria. Moderate eccentric calcified plaque formation is noted in the proximal left internal carotid artery consistent with less than 50% diameter stenosis based on ultrasound and Doppler criteria. Electronically Signed   By: Marijo Conception, M.D.   On: 10/27/2015 09:58    Follow up with PCP in 1 week.  Management plans discussed with the patient, family and they are in agreement.  CODE STATUS:     Code Status Orders        Start     Ordered   10/26/15 2252  Full code   Continuous     10/26/15 2257    Code Status History    Date Active Date Inactive Code Status Order ID Comments User Context   10/26/2015 10:57 PM  Full Code [id number]  Alesia Richards, MD ED   09/25/2015  1:27 PM 09/28/2015  5:26 PM Full Code [id number]  Hillary Bow, MD ED   01/19/2015  3:47 PM 01/20/2015 11:13 AM Full Code [id number]  Nestor Lewandowsky, MD Inpatient      TOTAL TIME TAKING CARE OF THIS PATIENT ON DAY OF DISCHARGE: more than 30 minutes.   Hillary Bow R M.D on 10/28/2015 at 1:08 PM  Between 7am to 6pm - Pager - [phone]  After 6pm go to [email or web address] - password [email or web address] [facility]   Office  [phone]  CC: Primary care physician; Cletis Athens, MD  Note: This dictation was prepared with Dragon dictation along with smaller phrase technology. Any transcriptional errors that result from this process are unintentional.

## 2015-10-31 LAB — CULTURE, BLOOD (ROUTINE X 2)
CULTURE: NO GROWTH
CULTURE: NO GROWTH

## 2015-11-14 ENCOUNTER — Emergency Department: Payer: Medicare Other

## 2015-11-14 ENCOUNTER — Inpatient Hospital Stay
Admission: EM | Admit: 2015-11-14 | Discharge: 2015-11-16 | DRG: 640 | Disposition: A | Discharge status: Hospice | Payer: Medicare Other | Attending: Internal Medicine | Admitting: Internal Medicine

## 2015-11-14 ENCOUNTER — Inpatient Hospital Stay: Payer: Medicare Other

## 2015-11-14 DIAGNOSIS — I10 Essential (primary) hypertension: Secondary | ICD-10-CM

## 2015-11-14 DIAGNOSIS — Z85118 Personal history of other malignant neoplasm of bronchus and lung: Secondary | ICD-10-CM

## 2015-11-14 DIAGNOSIS — G9341 Metabolic encephalopathy: Secondary | ICD-10-CM | POA: Diagnosis present

## 2015-11-14 DIAGNOSIS — Z66 Do not resuscitate: Secondary | ICD-10-CM

## 2015-11-14 DIAGNOSIS — Z888 Allergy status to other drugs, medicaments and biological substances status: Secondary | ICD-10-CM | POA: Diagnosis not present

## 2015-11-14 DIAGNOSIS — I2510 Atherosclerotic heart disease of native coronary artery without angina pectoris: Secondary | ICD-10-CM | POA: Diagnosis present

## 2015-11-14 DIAGNOSIS — C3490 Malignant neoplasm of unspecified part of unspecified bronchus or lung: Secondary | ICD-10-CM | POA: Diagnosis present

## 2015-11-14 DIAGNOSIS — Z79899 Other long term (current) drug therapy: Secondary | ICD-10-CM

## 2015-11-14 DIAGNOSIS — E876 Hypokalemia: Secondary | ICD-10-CM | POA: Diagnosis present

## 2015-11-14 DIAGNOSIS — I252 Old myocardial infarction: Secondary | ICD-10-CM | POA: Diagnosis not present

## 2015-11-14 DIAGNOSIS — R42 Dizziness and giddiness: Secondary | ICD-10-CM | POA: Diagnosis present

## 2015-11-14 DIAGNOSIS — C760 Malignant neoplasm of head, face and neck: Secondary | ICD-10-CM

## 2015-11-14 DIAGNOSIS — J690 Pneumonitis due to inhalation of food and vomit: Secondary | ICD-10-CM | POA: Diagnosis present

## 2015-11-14 DIAGNOSIS — C7951 Secondary malignant neoplasm of bone: Secondary | ICD-10-CM | POA: Diagnosis present

## 2015-11-14 DIAGNOSIS — R079 Chest pain, unspecified: Secondary | ICD-10-CM

## 2015-11-14 DIAGNOSIS — K219 Gastro-esophageal reflux disease without esophagitis: Secondary | ICD-10-CM | POA: Diagnosis present

## 2015-11-14 DIAGNOSIS — F17210 Nicotine dependence, cigarettes, uncomplicated: Secondary | ICD-10-CM

## 2015-11-14 DIAGNOSIS — R410 Disorientation, unspecified: Secondary | ICD-10-CM

## 2015-11-14 DIAGNOSIS — Z7982 Long term (current) use of aspirin: Secondary | ICD-10-CM | POA: Diagnosis not present

## 2015-11-14 DIAGNOSIS — Z515 Encounter for palliative care: Secondary | ICD-10-CM

## 2015-11-14 DIAGNOSIS — T17908A Unspecified foreign body in respiratory tract, part unspecified causing other injury, initial encounter: Secondary | ICD-10-CM

## 2015-11-14 DIAGNOSIS — F0390 Unspecified dementia without behavioral disturbance: Secondary | ICD-10-CM | POA: Diagnosis present

## 2015-11-14 DIAGNOSIS — Z85810 Personal history of malignant neoplasm of tongue: Secondary | ICD-10-CM | POA: Diagnosis not present

## 2015-11-14 LAB — TROPONIN I: Troponin I: <0.03 ng/mL (ref <0.03)

## 2015-11-14 LAB — CBC
HEMATOCRIT: 34.8 % — AB (ref 40.0–52.0)
HEMOGLOBIN: 12.4 g/dL — AB (ref 13.0–18.0)
MCH: 35 pg — ABNORMAL HIGH (ref 26.0–34.0)
MCHC: 35.7 g/dL (ref 32.0–36.0)
MCV: 98.1 fL (ref 80.0–100.0)
Platelets: 229 10*3/uL (ref 150–440)
RBC: 3.54 MIL/uL — ABNORMAL LOW (ref 4.40–5.90)
RDW: 14 % (ref 11.5–14.5)
WBC: 6.9 10*3/uL (ref 3.8–10.6)

## 2015-11-14 LAB — URINALYSIS COMPLETE WITH MICROSCOPIC (ARMC ONLY)
BILIRUBIN URINE: NEGATIVE
Bacteria, UA: NONE SEEN
GLUCOSE, UA: NEGATIVE mg/dL
Hgb urine dipstick: NEGATIVE
KETONES UR: NEGATIVE mg/dL
LEUKOCYTES UA: NEGATIVE
NITRITE: NEGATIVE
Protein, ur: NEGATIVE mg/dL
SPECIFIC GRAVITY, URINE: 1.013 (ref 1.005–1.030)
Squamous Epithelial / LPF: NONE SEEN
pH: 5 (ref 5.0–8.0)

## 2015-11-14 LAB — BASIC METABOLIC PANEL
ANION GAP: 5 (ref 5–15)
BUN: 18 mg/dL (ref 6–20)
CHLORIDE: 110 mmol/L (ref 101–111)
CO2: 30 mmol/L (ref 22–32)
Calcium: 14.5 mg/dL (ref 8.9–10.3)
Creatinine, Ser: 1.34 mg/dL — ABNORMAL HIGH (ref 0.61–1.24)
GFR calc Af Amer: 60 mL/min — ABNORMAL LOW (ref >60)
GFR, EST NON AFRICAN AMERICAN: 52 mL/min — AB (ref >60)
Glucose, Bld: 106 mg/dL — ABNORMAL HIGH (ref 65–99)
POTASSIUM: 3.3 mmol/L — AB (ref 3.5–5.1)
Sodium: 145 mmol/L (ref 135–145)

## 2015-11-14 LAB — LACTIC ACID, PLASMA: LACTIC ACID, VENOUS: 1.2 mmol/L (ref 0.5–1.9)

## 2015-11-14 LAB — AMMONIA

## 2015-11-14 MED ORDER — ZOLEDRONIC ACID 4 MG/5ML IV CONC
3.3000 mg | Freq: Once | INTRAVENOUS | Status: AC
  Administered 2015-11-14: 11:00:00 3.3 mg via INTRAVENOUS
  Filled 2015-11-14: qty 4.13

## 2015-11-14 MED ORDER — SODIUM CHLORIDE 0.9 % IV SOLN
INTRAVENOUS | Status: DC
  Administered 2015-11-14 – 2015-11-16 (×6): via INTRAVENOUS

## 2015-11-14 MED ORDER — SODIUM CHLORIDE 0.9 % IV BOLUS (SEPSIS)
500.0000 mL | Freq: Once | INTRAVENOUS | Status: AC
  Administered 2015-11-14: 500 mL via INTRAVENOUS

## 2015-11-14 MED ORDER — FUROSEMIDE 10 MG/ML IJ SOLN
20.0000 mg | Freq: Once | INTRAMUSCULAR | Status: AC
  Administered 2015-11-14: 20 mg via INTRAVENOUS
  Filled 2015-11-14: qty 4

## 2015-11-14 MED ORDER — RISPERIDONE 0.5 MG PO TBDP
0.5000 mg | ORAL_TABLET | Freq: Three times a day (TID) | ORAL | Status: DC | PRN
  Administered 2015-11-14: 21:00:00 0.5 mg via ORAL
  Filled 2015-11-14: qty 1

## 2015-11-14 MED ORDER — ACETAMINOPHEN 650 MG RE SUPP
650.0000 mg | Freq: Four times a day (QID) | RECTAL | Status: DC | PRN
  Administered 2015-11-15: 650 mg via RECTAL
  Filled 2015-11-14: qty 1

## 2015-11-14 MED ORDER — ONDANSETRON HCL 4 MG/2ML IJ SOLN
4.0000 mg | Freq: Four times a day (QID) | INTRAMUSCULAR | Status: DC | PRN
  Administered 2015-11-15: 4 mg via INTRAVENOUS
  Filled 2015-11-14: qty 2

## 2015-11-14 MED ORDER — ACETAMINOPHEN 325 MG PO TABS: 650.0000 mg | ORAL_TABLET | Freq: Four times a day (QID) | ORAL | Status: DC | PRN

## 2015-11-14 MED ORDER — POLYETHYLENE GLYCOL 3350 17 G PO PACK: 17.0000 g | PACK | Freq: Every day | ORAL | Status: DC | PRN

## 2015-11-14 MED ORDER — HYDROCODONE-ACETAMINOPHEN 5-325 MG PO TABS: 1.0000 | ORAL_TABLET | ORAL | Status: DC | PRN

## 2015-11-14 MED ORDER — ASPIRIN EC 81 MG PO TBEC: 81.0000 mg | DELAYED_RELEASE_TABLET | Freq: Every day | ORAL | Status: DC

## 2015-11-14 MED ORDER — ENOXAPARIN SODIUM 40 MG/0.4ML ~~LOC~~ SOLN
40.0000 mg | SUBCUTANEOUS | Status: DC
  Administered 2015-11-14 – 2015-11-15 (×2): 40 mg via SUBCUTANEOUS
  Filled 2015-11-14 (×2): qty 0.4

## 2015-11-14 MED ORDER — ONDANSETRON HCL 4 MG PO TABS: 4.0000 mg | ORAL_TABLET | Freq: Four times a day (QID) | ORAL | Status: DC | PRN

## 2015-11-14 MED ORDER — LORAZEPAM 2 MG/ML IJ SOLN
2.0000 mg | Freq: Once | INTRAMUSCULAR | Status: AC
  Administered 2015-11-14: 2 mg via INTRAVENOUS
  Filled 2015-11-14: qty 1

## 2015-11-14 MED ORDER — METOPROLOL TARTRATE 25 MG PO TABS: 25.0000 mg | ORAL_TABLET | Freq: Two times a day (BID) | ORAL | Status: DC

## 2015-11-14 MED ORDER — GABAPENTIN 300 MG PO CAPS: 300.0000 mg | ORAL_CAPSULE | Freq: Two times a day (BID) | ORAL | Status: DC

## 2015-11-14 MED ORDER — LAMOTRIGINE 25 MG PO TABS: 50.0000 mg | ORAL_TABLET | Freq: Every day | ORAL | Status: DC

## 2015-11-14 MED ORDER — SODIUM CHLORIDE 0.9% FLUSH
3.0000 mL | Freq: Two times a day (BID) | INTRAVENOUS | Status: DC
  Administered 2015-11-14 – 2015-11-16 (×3): 3 mL via INTRAVENOUS

## 2015-11-14 MED ORDER — DOCUSATE SODIUM 100 MG PO CAPS: 100.0000 mg | ORAL_CAPSULE | Freq: Two times a day (BID) | ORAL | Status: DC

## 2015-11-14 NOTE — ED Notes (Signed)
Safety sitter at bedside 
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
## 2015-11-14 NOTE — ED Notes (Signed)
Pt demonstrates response to pain, unable to understand verbal, pt disoriented x4, pt becomes agitated when touched
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
## 2015-11-14 NOTE — ED Notes (Signed)
EMS pt from [facility]; reports called out for chest pain; pt c/o generalized pain to EMS; staff reported pt usually will follow commands, EMS reports they did not experience that with pt; EMS reports pt pulled of cardiac monitors and pulled out the IV they had started; pt was given 2 SL Nitro by nursing home staff prior to arrival of EMS; pt not answering any questions at this time; awake and alert

## 2015-11-14 NOTE — NC FL2 (Signed)
[place] LEVEL OF CARE SCREENING TOOL     IDENTIFICATION  Patient Name: Vernon Hayes Birthdate: [birth year] Sex: male Admission Date (Current Location): 11/14/2015  [place] and [place] Number:  Engineering geologist and Address:  [facility], [address] [place], [place] [address]      Provider Number: [id number]  Attending Physician Name and Address:  Bettey Costa, MD  Relative Name and Phone Number:       Current Level of Care: Hospital Recommended Level of Care: [place] Prior Approval Number:    Date Approved/Denied:   PASRR Number: [id number]  Discharge Plan: SNF    Current Diagnoses: Patient Active Problem List   Diagnosis Date Noted  . Lung cancer ([place])   . Protein-calorie malnutrition, severe 10/28/2015  . Altered mental status 10/26/2015  . Hypercalcemia 09/25/2015  . Vertigo 09/23/2015  . 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 Malnutrition of moderate degree ([place]) 01/22/2015  . Lung mass 01/19/2015  . Pre-operative cardiovascular examination 01/12/2015  . Essential hypertension 01/12/2015  . Abnormal PET scan of lung     Orientation RESPIRATION BLADDER Height & Weight        Normal Incontinent Weight: 145 lb (65.772 kg) Height:  '6\' 1"'$  (185.4 cm)  BEHAVIORAL SYMPTOMS/MOOD NEUROLOGICAL BOWEL NUTRITION STATUS      Incontinent Diet (Regular Diet)  AMBULATORY STATUS COMMUNICATION OF NEEDS Skin   Extensive Assist Verbally Normal                       Personal Care Assistance Level of Assistance  Bathing, Feeding, Dressing Bathing Assistance: Limited assistance Feeding assistance: Limited assistance Dressing Assistance: Limited assistance     Functional Limitations Info  Sight, Hearing, Speech Sight Info: Adequate Hearing Info: Adequate Speech Info: Adequate    SPECIAL CARE FACTORS FREQUENCY  PT (By licensed PT)     PT Frequency: 5              Contractures      Additional  Factors Info  Code Status, Allergies Code Status Info: Full Code Allergies Info: Tetracyclines and related           Current Medications (11/14/2015):  This is the current hospital active medication list Current Facility-Administered Medications  Medication Dose Route Frequency Provider Last Rate Last Dose  . 0.9 %  sodium chloride infusion   Intravenous Continuous Bettey Costa, MD 100 mL/hr at 11/14/15 1058    . acetaminophen (TYLENOL) tablet 650 mg  650 mg Oral Q6H PRN Bettey Costa, MD       Or  . acetaminophen (TYLENOL) suppository 650 mg  650 mg Rectal Q6H PRN Bettey Costa, MD      . aspirin EC tablet 81 mg  81 mg Oral Daily Sital Mody, MD   81 mg at 11/14/15 1134  . docusate sodium (COLACE) capsule 100 mg  100 mg Oral BID Bettey Costa, MD   100 mg at 11/14/15 1135  . enoxaparin (LOVENOX) injection 40 mg  40 mg Subcutaneous Q24H Sital Mody, MD      . gabapentin (NEURONTIN) capsule 300 mg  300 mg Oral BID Bettey Costa, MD   300 mg at 11/14/15 1135  . HYDROcodone-acetaminophen (NORCO/VICODIN) 5-325 MG per tablet 1-2 tablet  1-2 tablet Oral Q4H PRN Bettey Costa, MD      . lamoTRIgine (LAMICTAL) tablet 50 mg  50 mg Oral QHS Bettey Costa, MD      . metoprolol tartrate (LOPRESSOR)  tablet 25 mg  25 mg Oral BID Bettey Costa, MD   25 mg at 11/14/15 1135  . ondansetron (ZOFRAN) tablet 4 mg  4 mg Oral Q6H PRN Bettey Costa, MD       Or  . ondansetron (ZOFRAN) injection 4 mg  4 mg Intravenous Q6H PRN Sital Mody, MD      . polyethylene glycol (MIRALAX / GLYCOLAX) packet 17 g  17 g Oral Daily PRN Bettey Costa, MD      . risperiDONE (RISPERDAL M-TABS) disintegrating tablet 0.5 mg  0.5 mg Oral TID PRN Bettey Costa, MD      . sodium chloride flush (NS) 0.9 % injection 3 mL  3 mL Intravenous Q12H Bettey Costa, MD   3 mL at 11/14/15 1058     Discharge Medications: Please see discharge summary for a list of discharge medications.  Relevant Imaging Results:  Relevant Lab Results:   Additional Information SSN:   [id number]  Darden Dates, LCSW

## 2015-11-14 NOTE — Clinical Social Work Note (Signed)
Clinical Social Work Assessment  Patient Details  Name: Vernon Hayes MRN: [id number] Date of Birth: [birth year]  Date of referral:  11/14/15               Reason for consult:  Facility Placement                Permission sought to share information with:  Family Supports Permission granted to share information::  Yes, Verbal Permission Granted  Name::     DeAnna Andrini-Carb  Relationship::  daughter  Contact Information:  [phone]  Housing/Transportation Living arrangements for the past 2 months:  [place] of Information:  Adult Children, Other (Comment Required) Development worker, international aid) Patient Interpreter Needed:  None Criminal Activity/Legal Involvement Pertinent to Current Situation/Hospitalization:  No - Comment as needed Significant Relationships:  Adult Children, Other Family Members Lives with:  Facility Resident Do you feel safe going back to the place where you live?  Yes Need for family participation in patient care:  Yes (Comment)  Care giving concerns:  No care giving concerns identified.   Social Worker assessment / plan:  CSW met with pt's counsin to address consult. CSW introduced herself and explained role of social work. CSW also explained the process of discharging to SNF. Pt's cousin was interested HCPOA, however is not oriented therefore unable to complete paperwork. CSW also address guardianship. Pt's cousin shared that pt has a daughter who lives in [place], and he provided her contact information. CSW explained that the primary decision will be made by pt's next of kin, which is pt's daughter. CSW called pt's daughter to address consult. Pt's daughter is aware pt is in admitted and has been speaking with pt's cousin. Pt's daughter shared that pt has other children, however she does not know their names or where they live. Pt's daughter is in agreement for pt's cousin to be involved. CSW encouraged pt's daughter to talk to pt's  cousin about decisions as pt's cousin is local and lives out of town. Pt's daughter is agreeable to pt return to [facility] if he is able to. Palliative Care consult is pending. CSW will follow up with [place] regarding pt's potential return. CSW will continue to follow.   Employment status:  Retired Forensic scientist:  Medicare PT Recommendations:  Not assessed at this time Information / Referral to community resources:  [employer])  Patient/Family's Response to care:  Pt's daughter and cousin were appreciative of CSW support.   Patient/Family's Understanding of and Emotional Response to Diagnosis, Current Treatment, and Prognosis:  Pt's daughter and cousin understand that is not oriented and would like to speak with MD about prognosis.   Emotional Assessment Appearance:  Appears stated age Attitude/Demeanor/Rapport:  Unable to Assess Affect (typically observed):  Unable to Assess Orientation:  Fluctuating Orientation (Suspected and/or reported Sundowners) Alcohol / Substance use:  Never Used Psych involvement (Current and /or in the community):  No (Comment)  Discharge Needs  Concerns to be addressed:  Adjustment to Illness Readmission within the last 30 days:  Yes Current discharge risk:  Chronically ill Barriers to Discharge:  Continued Medical Work up   [employer], LCSW 11/14/2015, 2:54 PM

## 2015-11-14 NOTE — Progress Notes (Signed)
   11/14/15 1400  Clinical Encounter Type  Visited With Patient and family together  Visit Type Initial  Referral From Nurse;Social work  Consult/Referral To Chaplain  Spiritual Encounters  Spiritual Needs Emotional;Grief support (Advanced directive education.)  Stress Factors  Patient Stress Factors Exhausted;Major life changes  Family Stress Factors Lack of knowledge;Major life changes  Patients cousin wanted education on being health care power of attorney.  I explained process to him.
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
## 2015-11-14 NOTE — ED Notes (Signed)
Dr. Lisbeth Renshaw made aware of pt being transferred to 1C

## 2015-11-14 NOTE — ED Notes (Signed)
Patient transported to CT 

## 2015-11-15 ENCOUNTER — Inpatient Hospital Stay: Payer: Medicare Other

## 2015-11-15 DIAGNOSIS — R64 Cachexia: Secondary | ICD-10-CM

## 2015-11-15 DIAGNOSIS — R42 Dizziness and giddiness: Secondary | ICD-10-CM

## 2015-11-15 DIAGNOSIS — R5383 Other fatigue: Secondary | ICD-10-CM

## 2015-11-15 LAB — BASIC METABOLIC PANEL
Anion gap: 5 (ref 5–15)
BUN: 19 mg/dL (ref 6–20)
CO2: 30 mmol/L (ref 22–32)
Calcium: 13 mg/dL — ABNORMAL HIGH (ref 8.9–10.3)
Chloride: 113 mmol/L — ABNORMAL HIGH (ref 101–111)
Creatinine, Ser: 1.47 mg/dL — ABNORMAL HIGH (ref 0.61–1.24)
GFR, EST AFRICAN AMERICAN: 54 mL/min — AB (ref >60)
GFR, EST NON AFRICAN AMERICAN: 46 mL/min — AB (ref >60)
Glucose, Bld: 97 mg/dL (ref 65–99)
POTASSIUM: 3.5 mmol/L (ref 3.5–5.1)
SODIUM: 148 mmol/L — AB (ref 135–145)

## 2015-11-15 LAB — CBC
HEMATOCRIT: 35.8 % — AB (ref 40.0–52.0)
Hemoglobin: 12.3 g/dL — ABNORMAL LOW (ref 13.0–18.0)
MCH: 34 pg (ref 26.0–34.0)
MCHC: 34.4 g/dL (ref 32.0–36.0)
MCV: 98.7 fL (ref 80.0–100.0)
PLATELETS: 200 10*3/uL (ref 150–440)
RBC: 3.63 MIL/uL — AB (ref 4.40–5.90)
RDW: 13.7 % (ref 11.5–14.5)
WBC: 7.8 10*3/uL (ref 3.8–10.6)

## 2015-11-15 LAB — LACTIC ACID, PLASMA
LACTIC ACID, VENOUS: 2.3 mmol/L — AB (ref 0.5–1.9)
Lactic Acid, Venous: 0.9 mmol/L (ref 0.5–1.9)

## 2015-11-15 LAB — ALBUMIN: ALBUMIN: 3.1 g/dL — AB (ref 3.5–5.0)

## 2015-11-15 LAB — CALCIUM, URINE, RANDOM: Calcium, Ur: 12 mg/dL

## 2015-11-15 MED ORDER — CETYLPYRIDINIUM CHLORIDE 0.05 % MT LIQD
7.0000 mL | Freq: Two times a day (BID) | OROMUCOSAL | Status: DC
  Administered 2015-11-15 – 2015-11-16 (×4): 7 mL via OROMUCOSAL

## 2015-11-15 MED ORDER — METOPROLOL TARTRATE 5 MG/5ML IV SOLN
5.0000 mg | Freq: Four times a day (QID) | INTRAVENOUS | Status: DC | PRN
  Administered 2015-11-15: 5 mg via INTRAVENOUS
  Filled 2015-11-15: qty 5

## 2015-11-15 MED ORDER — CHLORHEXIDINE GLUCONATE 0.12 % MT SOLN
15.0000 mL | Freq: Two times a day (BID) | OROMUCOSAL | Status: DC
  Administered 2015-11-15 – 2015-11-16 (×3): 15 mL via OROMUCOSAL
  Filled 2015-11-15: qty 15

## 2015-11-15 MED ORDER — METOPROLOL TARTRATE 5 MG/5ML IV SOLN: 5.0000 mg | Freq: Four times a day (QID) | INTRAVENOUS | Status: DC

## 2015-11-15 MED ORDER — METOPROLOL TARTRATE 5 MG/5ML IV SOLN: 5.0000 mg | INTRAVENOUS | Status: DC | PRN

## 2015-11-15 MED ORDER — HYDRALAZINE HCL 20 MG/ML IJ SOLN
10.0000 mg | INTRAMUSCULAR | Status: DC | PRN
  Administered 2015-11-15: 10 mg via INTRAVENOUS
  Filled 2015-11-15: qty 1

## 2015-11-15 MED ORDER — HYDRALAZINE HCL 20 MG/ML IJ SOLN: 10.0000 mg | INTRAMUSCULAR | Status: DC | PRN

## 2015-11-15 MED ORDER — PIPERACILLIN-TAZOBACTAM 3.375 G IVPB
3.3750 g | Freq: Three times a day (TID) | INTRAVENOUS | Status: DC
  Administered 2015-11-15 – 2015-11-16 (×4): 3.375 g via INTRAVENOUS
  Filled 2015-11-15 (×6): qty 50

## 2015-11-15 MED ORDER — METOPROLOL TARTRATE 5 MG/5ML IV SOLN
5.0000 mg | Freq: Four times a day (QID) | INTRAVENOUS | Status: DC | PRN
  Administered 2015-11-15: 13:00:00 5 mg via INTRAVENOUS
  Filled 2015-11-15: qty 5

## 2015-11-15 NOTE — Progress Notes (Addendum)
Speech Therapy Note: order received, chart reviewed and MD consulted re: pt's status. Pt has been readmitted to the hospital w/ c/o chest pain and a known history of Squamous Cell carcinoma of right lung and head and neck cancer. Pt is agitated; baseline Dementia.  During the last admission, pt had a MBSS which revealed moderate oropharyngeal phase dysphagia w/ risk for aspiration and pulmonary compromise from the potential aspiration. During the pharyngeal phase, a delayed pharyngeal swallow initiation was noted w/ consistent laryngeal penetration(into the upper airway entrance) w/ all consistencies assessed; moderate pharyngeal residue remained post swallow in both the valleculae and pyriform sinuses secondary to the reduced hyolaryngeal excursion w/ anterior movement w/ the decreased pharyngeal pressure during the swallow. Pt exhibited reduced pharyngeal sensation to the residue remaining post swallow. Between 2-3 trials, the laryngeal penetration from the pharyngeal residue coating the epiglottis was noted to which pt was insensate to and did not exhibit an immediate cough or throat clearing in attempt to clear the penetrated material in the laryngeal vestibule. With these results of the recent MBSS and high risk for Aspiration, and pt's current medical and Cognitive decline, recommend NPO status until MD and family have discussed pt's medical issues/status and risks w/ po intake as well as his plan of care. MD agreed w/ NPO status at this time. Recommend thorough oral care frequently for hygiene and stimulation. NSG updated. ST will f/u w/ pt's status tomorrow for any assessment/education.

## 2015-11-15 NOTE — Progress Notes (Signed)
[facility] [place] NAME: Vernon Hayes    MR#:  [id number]  DATE OF BIRTH:  Feb 28, 1946  SUBJECTIVE:   Patient's calcium at 13 this am  Had fever last night  REVIEW OF SYSTEMS:    Review of Systems  Unable to perform ROS Constitutional: Positive for fever.    Tolerating Diet:NPO      DRUG ALLERGIES:   Allergies  Allergen Reactions  . Tetracyclines & Related Hives    VITALS:  Blood pressure 130/80, pulse 104, temperature 98.8 F (37.1 C), temperature source Axillary, resp. rate 20, height '6\' 1"'$  (1.854 m), weight 65.772 kg (145 lb), SpO2 99 %.  PHYSICAL EXAMINATION:   Physical Exam  Constitutional: He is well-developed, well-nourished, and in no distress. No distress.  Critically ill  HENT:  Head: Normocephalic.  Eyes: No scleral icterus.  Neck: Neck supple. No JVD present. No tracheal deviation present.  Cardiovascular: Normal rate, regular rhythm and normal heart sounds.  Exam reveals no gallop and no friction rub.   No murmur heard. tachy  Pulmonary/Chest: Effort normal and breath sounds normal. No respiratory distress. He has no wheezes. He has no rales. He exhibits no tenderness.  Abdominal: Soft. Bowel sounds are normal. He exhibits no distension and no mass. There is no tenderness. There is no rebound and no guarding.  Musculoskeletal: Normal range of motion. He exhibits no edema.  Neurological:  lethargic  Skin: Skin is warm. No rash noted. No erythema.      LABORATORY PANEL:   CBC  Recent Labs Lab 11/15/15 0441  WBC 7.8  HGB 12.3*  HCT 35.8*  PLT 200   ------------------------------------------------------------------------------------------------------------------  Chemistries   Recent Labs Lab 11/15/15 0441  NA 148*  K 3.5  CL 113*  CO2 30  GLUCOSE 97  BUN 19  CREATININE 1.47*  CALCIUM 13.0*    ------------------------------------------------------------------------------------------------------------------  Cardiac Enzymes  Recent Labs Lab 11/14/15 0907 11/14/15 1432 11/14/15 2150  TROPONINI <0.03 <0.03 <0.03   ------------------------------------------------------------------------------------------------------------------  RADIOLOGY:  Ct Abdomen Pelvis Wo Contrast  11/14/2015  CLINICAL DATA:  70 year old male inpatient with 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 EXAM: CT CHEST, ABDOMEN AND PELVIS WITHOUT CONTRAST TECHNIQUE: Multidetector CT imaging of the chest, abdomen and pelvis was performed following the standard protocol without IV contrast. COMPARISON:  12/17/2014 PET-CT. 06/25/2015 chest CT. 03/18/2012 CT abdomen. FINDINGS: Mildly motion degraded scan. CT CHEST Mediastinum/Nodes: Normal heart size. Stable trace pericardial fluid/ thickening. Left main, left anterior descending, left circumflex and right coronary atherosclerosis. Right internal jugular MediPort terminates in the lower third of the superior vena cava. Atherosclerotic nonaneurysmal thoracic aorta. Normal caliber pulmonary arteries. No discrete thyroid nodules. Unremarkable esophagus. No pathologically enlarged axillary, mediastinal or gross hilar lymph nodes, noting limited sensitivity for the detection of hilar adenopathy on this noncontrast study. Lungs/Pleura: No pneumothorax. No pleural effusion. Large volume of secretions fill the right mainstem bronchus, lobar and segmental right upper, right middle and right lower lobe bronchi, and left lower lobe segmental bronchi. Patchy tree-in-bud opacities in the dependent lower lobes bilaterally, most consistent with aspiration pneumonitis. No acute consolidative airspace disease. Irregular nodular 1.8 x 1.7 cm focus of  consolidation in the right upper lobe adjacent to the wedge resection suture line (series 4/image 42) previously measured 2.0 x 1.7 cm on 06/25/2015 chest CT using similar  measurement technique, not appreciably changed, favor postsurgical scarring. Mild patchy centrilobular ground-glass nodularity throughout both lungs, upper lobe predominant. Musculoskeletal: There is a large destructive lytic lesion replacing much of the left humeral head. No additional aggressive appearing bone lesions in the chest. Moderate to marked thoracic spondylosis. Stable subcutaneous lipoma in the midline back centered at the T6 level. CT ABDOMEN AND PELVIS Hepatobiliary: Normal liver size. Hypodense 0.8 cm posterior right liver lobe lesion is too small to characterize and appears stable back to 03/18/2012, suggesting a benign lesion. No new definite liver lesions, noting limitation by streak artifact from the patient's upper extremities. Nondistended gallbladder contains a calcified 2.0 cm gallstone, with no gallbladder wall thickening or pericholecystic fluid. No biliary ductal dilatation. Pancreas: Normal, with no mass or duct dilation. Spleen: Normal size. No mass. Adrenals/Urinary Tract: Normal adrenals. No renal stones. No hydronephrosis. No contour deforming renal mass. Bladder is relatively collapsed by indwelling Foley catheter. No definite bladder wall thickening. Stomach/Bowel: Grossly normal stomach. Normal caliber small bowel with no small bowel wall thickening. Appendectomy. Normal large bowel with no diverticulosis, large bowel wall thickening or pericolonic fat stranding. Vascular/Lymphatic: Atherosclerotic nonaneurysmal abdominal aorta. No pathologically enlarged lymph nodes in the abdomen or pelvis. Reproductive: Stable mild prostatomegaly. Other: No pneumoperitoneum, ascites or focal fluid collection. Musculoskeletal: No aggressive appearing focal osseous lesions. Marked lumbar spondylosis. IMPRESSION: 1. 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 2. No additional potential sites of metastatic disease in the chest, abdomen or pelvis on this motion degraded noncontrast CT study. 3. Stable nodular focus of consolidation adjacent to the wedge resection suture line in the right upper lobe, favor nodular scarring. Continued attention on follow-up chest CT is advised. 4. Large volume of secretions in the central airways as described, probably representing aspirated material. Patchy tree-in-bud opacities in the dependent lower lobes bilaterally, consistent with mild aspiration pneumonitis. 5. Patchy ground-glass centrilobular nodularity in both lungs, which could represent smoking related interstitial lung disease (respiratory bronchiolitis) if the patient is a current smoker. 6. Additional findings include aortic atherosclerosis, left main and 3 vessel coronary atherosclerosis, cholelithiasis and mild prostatomegaly. Electronically Signed   By: Ilona Sorrel M.D.   On: 11/14/2015 14:01   Dg Chest 1 View  11/15/2015  CLINICAL DATA:  Aspiration pneumonitis. History of lung carcinoma. Hypertension. EXAM: CHEST 1 VIEW COMPARISON:  Chest radiograph and chest CT November 14, 2015 FINDINGS: Extensive bony destruction is noted in the distal left clavicle and proximal humeral regions. There is no edema or consolidation. The heart size and pulmonary vascular normal. Aorta is prominent with calcification in the arch region, stable. Port-A-Cath tip is in the superior vena cava. No pneumothorax. No adenopathy evident. There is arthropathy in the right shoulder. IMPRESSION: Widespread bony destruction involving the left distal clavicle and proximal left humerus consistent with metastatic disease. No parenchymal lung edema or consolidation. Stable cardiac silhouette. Prominence of the aorta may reflect chronic hypertensive change. There is aortic atherosclerosis.  Electronically Signed   By: Lowella Grip III M.D.   On: 11/15/2015 08:05   Ct Head Wo Contrast  11/14/2015  CLINICAL DATA:  Increasing confusion EXAM: CT HEAD WITHOUT CONTRAST TECHNIQUE: Contiguous axial images were obtained from the base of the skull through the vertex without intravenous contrast. COMPARISON:  Head CT October 26, 2015 and brain MRI October 27, 2015 FINDINGS: Brain: There is mild diffuse atrophy. There is no intracranial mass, hemorrhage, extra-axial fluid collection, or  midline shift. There is extensive small vessel disease throughout the centra semiovale bilaterally. Small vessel disease is noted in the lower pons region in the basilar perforator distribution. There is evidence of a prior small lacunar infarct in the lateral right cerebellum inferiorly, stable. There is small vessel disease throughout the anterior limbs of each external capsule, stable. No acute infarct evident. Vascular: There are foci of calcification in the cavernous carotid arteries bilaterally. No hyperdense vessels noted. Skull: The bony calvarium appears intact. Sinuses/Orbits: The orbits appear symmetric bilaterally. The visualized paranasal sinuses are clear. Other: Mastoid air cells are clear. Note that the mastoids on the left are somewhat hypoplastic. IMPRESSION: Atrophy with extensive supratentorial infratentorial small vessel disease. 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 No acute infarct evident. No hemorrhage or mass effect. There are foci of cavernous carotid artery calcification bilaterally. Comment: There is slightly greater atrophy in the posterior medial right occipital lobe compared to its counterpart on the left. This appearance is stable compared to recent CT and MR evaluations. No acute infarct is appreciable in this area, but there does appear to be mild asymmetric small vessel disease in this area on the right, stable. Electronically Signed   By: Lowella Grip III  M.D.   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 EXAM: CT CHEST, ABDOMEN AND PELVIS WITHOUT CONTRAST TECHNIQUE: Multidetector CT imaging of the chest, abdomen and pelvis was performed following the standard protocol without IV contrast. COMPARISON:  12/17/2014 PET-CT. 06/25/2015 chest CT. 03/18/2012 CT abdomen. FINDINGS: Mildly motion degraded scan. CT CHEST Mediastinum/Nodes: Normal heart size. Stable trace pericardial fluid/ thickening. Left main, left anterior descending, left circumflex and right coronary atherosclerosis. Right internal jugular MediPort terminates in the lower third of the superior vena cava. Atherosclerotic nonaneurysmal thoracic aorta. Normal caliber pulmonary arteries. No discrete thyroid nodules. Unremarkable esophagus. No pathologically enlarged axillary, mediastinal or gross hilar lymph nodes, noting limited sensitivity for the detection of hilar adenopathy on this noncontrast study. Lungs/Pleura: No pneumothorax. No pleural effusion. Large volume of secretions fill the right mainstem bronchus, lobar and segmental right upper, right middle and right lower lobe bronchi, and left lower lobe segmental bronchi. Patchy tree-in-bud opacities in the dependent lower lobes bilaterally, most consistent with aspiration pneumonitis. No acute consolidative airspace disease. Irregular nodular 1.8 x 1.7 cm focus of consolidation in the right upper lobe adjacent to the wedge resection suture line (series 4/image 42) previously measured 2.0 x 1.7 cm on 06/25/2015 chest CT using similar measurement technique, not appreciably changed, favor postsurgical scarring. Mild patchy centrilobular ground-glass nodularity throughout both lungs, upper lobe predominant.  Musculoskeletal: There is a large destructive lytic lesion replacing much of the left humeral head. No additional aggressive appearing bone lesions in the chest. Moderate to marked thoracic spondylosis. Stable subcutaneous lipoma in the midline back centered at the T6 level. CT ABDOMEN AND PELVIS Hepatobiliary: Normal liver size. Hypodense 0.8 cm posterior right liver lobe lesion is too small to characterize and appears stable back to 03/18/2012, suggesting a benign lesion. No new definite liver lesions, noting limitation by streak artifact from the patient's upper extremities. Nondistended gallbladder contains a calcified 2.0 cm gallstone, with no gallbladder wall thickening or pericholecystic fluid. No biliary ductal dilatation. Pancreas:  Normal, with no mass or duct dilation. Spleen: Normal size. No mass. Adrenals/Urinary Tract: Normal adrenals. No renal stones. No hydronephrosis. No contour deforming renal mass. Bladder is relatively collapsed by indwelling Foley catheter. No definite bladder wall thickening. Stomach/Bowel: Grossly normal stomach. Normal caliber small bowel with no small bowel wall thickening. Appendectomy. Normal large bowel with no diverticulosis, large bowel wall thickening or pericolonic fat stranding. Vascular/Lymphatic: Atherosclerotic nonaneurysmal abdominal aorta. No pathologically enlarged lymph nodes in the abdomen or pelvis. Reproductive: Stable mild prostatomegaly. Other: No pneumoperitoneum, ascites or focal fluid collection. Musculoskeletal: No aggressive appearing focal osseous lesions. Marked lumbar spondylosis. IMPRESSION: 1. 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 2. No additional potential sites of metastatic disease in the chest, abdomen or pelvis on this motion degraded noncontrast CT study. 3. Stable nodular focus of consolidation adjacent to the wedge resection suture line in the right upper  lobe, favor nodular scarring. Continued attention on follow-up chest CT is advised. 4. Large volume of secretions in the central airways as described, probably representing aspirated material. Patchy tree-in-bud opacities in the dependent lower lobes bilaterally, consistent with mild aspiration pneumonitis. 5. Patchy ground-glass centrilobular nodularity in both lungs, which could represent smoking related interstitial lung disease (respiratory bronchiolitis) if the patient is a current smoker. 6. Additional findings include aortic atherosclerosis, left main and 3 vessel coronary atherosclerosis, cholelithiasis and mild prostatomegaly. Electronically Signed   By: Ilona Sorrel M.D.   On: 11/14/2015 14:01   Dg Chest Port 1 View  11/14/2015  CLINICAL DATA:  Acute onset of generalized chest pain. Initial encounter. EXAM: PORTABLE CHEST 1 VIEW COMPARISON:  Chest radiograph performed 10/27/2015 FINDINGS: The lungs are well-aerated. Postoperative scarring is noted at the right lung apex. There is no evidence of focal opacification, pleural effusion or pneumothorax. The cardiomediastinal silhouette is within normal limits. There is persistent destruction of the distal left clavicle, similar in appearance to the recent prior study. Degenerative change is noted about the right acromion. A right-sided chest port is noted ending about the distal SVC. IMPRESSION: 1. Destruction of the distal clavicle, similar in appearance to the recent prior study, reflecting the patient's known metastatic carcinoma. 2. Postoperative scarring at the right lung apex. Lungs otherwise grossly clear. Electronically Signed   By: Garald Balding M.D.   On: 11/14/2015 06:27     ASSESSMENT AND PLAN:   70 year old male with history of squamous RU lung cancer and locally advanced head and neck cancer currently not on chemotherapy followed by oncology who presents with altered mental status with baseline dementia.  1. Acute metabolic  encephalopathy: This is likely due to hypercalcemia. There is no evidence of UTI or pneumonia.  2. Hypercalcemia likley from metastatic disease: Appreciate ONCOLOGY evaluation.  He received Zometa yesterday, may reorder by ONCOLGY today. Continue IVF and order albumin.   3. Essential hypertension:ontinue metoprolol.  4. Hypokalemia: Replete and recheck in a.m.  5. Lung cancer and head and neck cancer with metastatic disease on CT scan: VERY POOR overall prognosis. Patient would benefit from hospice. D/w Magda Paganini this am. She will talk with family about hospice  6. Aspiration PNA: etiology of fevers. Hx of aspiraton. Start ZOSYN and follow blood cultures.  D/w Santiago Glad and CM palliative care consult for am     CODE STATUS: FULL  TOTAL TIME TAKING CARE OF THIS PATIENT: 24 minutes.     POSSIBLE D/C ??, DEPENDING ON CLINICAL CONDITION.   Tacora Athanas  M.D on 11/15/2015 at 12:36 PM  Between 7am to 6pm - Pager - [phone] After 6pm go to [email or web address] - password [email or web address] [facility]  Office  [phone]  CC: Primary care physician; Cletis Athens, MD  Note: This dictation was prepared with Dragon dictation along with smaller phrase technology. Any transcriptional errors that result from this process are unintentional.

## 2015-11-15 NOTE — Progress Notes (Signed)
Pharmacy Antibiotic Note  Vernon Hayes is a 70 y.o. male admitted on 11/14/2015 with aspiration pneumonia.  Pharmacy has been consulted for piperacillin/tazobactam dosing.  Plan: Piperacillin/tazobactam 3.375 g IV q8h EI  Height: '6\' 1"'$  (185.4 cm) Weight: 145 lb (65.772 kg) IBW/kg (Calculated) : 79.9  Temp (24hrs), Avg:100 F (37.8 C), Min:98 F (36.7 C), Max:103.2 F (39.6 C)   Recent Labs Lab 11/14/15 0555 11/15/15 0423 11/15/15 0441 11/15/15 0732  WBC 6.9  --  7.8  --   CREATININE 1.34*  --  1.47*  --   LATICACIDVEN 1.2 2.3*  --  0.9    Estimated Creatinine Clearance: 42.9 mL/min (by C-G formula based on Cr of 1.47).    Allergies  Allergen Reactions  . Tetracyclines & Related Hives   Antimicrobials this admission: Piperacillin/tazobactam 7/10 >>   Dose adjustments this admission:  Microbiology results: None  Thank you for allowing pharmacy to be a part of this patient's care.  Lenis Noon, PharmD, BCPS 11/15/2015 8:17 AM

## 2015-11-15 NOTE — Progress Notes (Signed)
Took over care of patient at 2330.

## 2015-11-15 NOTE — Progress Notes (Signed)
Vernon Hayes   DOB:06-25-1945   GQ#:[id number]    Subjective: Patient resting in the bed; drowsy and unable to converse. He is alone.  ROS: Unable to assess  Objective:   Filed Vitals:   11/15/15 1605 11/15/15 1712  BP: 169/117 154/89  Pulse: 102 94  Temp:    Resp:       Intake/Output Summary (Last 24 hours) at 11/15/15 1900 Last data filed at 11/15/15 1500  Gross per 24 hour  Intake   2001 ml  Output    575 ml  Net   1426 ml    GENERAL:Cachectic appearing African-American male patient. Resting in the bed. He is alone. SKIN: Dry EYES: normal, OROPHARYNX: Dry  NECK: supple, thyroid normal size, non-tender, without nodularity LYMPH:  no palpable lymphadenopathy in the cervical, axillary or inguinal LUNGS: Decreased breath sounds bilaterally to auscultation HEART: regular rate & rhythm and no murmurs and no lower extremity edema ABDOMEN:abdomen soft, non-tender and normal bowel sounds Musculoskeletal:no cyanosis of digits and no clubbing;  patient in pain in moving left shoulder;  NEURO: Drowsy; unable to do neurologic exam.   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 EXAM: CT CHEST, ABDOMEN AND PELVIS WITHOUT CONTRAST TECHNIQUE: Multidetector CT imaging of the chest, abdomen and pelvis was performed following the standard protocol without IV contrast. COMPARISON:  12/17/2014 PET-CT. 06/25/2015 chest CT. 03/18/2012 CT abdomen.  FINDINGS: Mildly motion degraded scan. CT CHEST Mediastinum/Nodes: Normal heart size. Stable trace pericardial fluid/ thickening. Left main, left anterior descending, left circumflex and right coronary atherosclerosis. Right internal jugular MediPort terminates in the lower third of the superior vena cava. Atherosclerotic nonaneurysmal thoracic aorta. Normal caliber pulmonary arteries. No discrete thyroid nodules. Unremarkable esophagus. No pathologically enlarged axillary, mediastinal or gross hilar lymph nodes, noting limited sensitivity for the detection of hilar adenopathy on this noncontrast study. Lungs/Pleura: No pneumothorax. No pleural effusion. Large volume of secretions fill the right mainstem bronchus, lobar and segmental right upper, right middle and right lower lobe bronchi, and left lower lobe segmental bronchi. Patchy tree-in-bud opacities in the dependent lower lobes bilaterally, most consistent with aspiration pneumonitis. No acute consolidative airspace disease. Irregular nodular 1.8 x 1.7 cm focus of consolidation in the right upper lobe adjacent to the wedge resection suture line (series 4/image 42) previously measured 2.0 x 1.7 cm on 06/25/2015 chest CT using similar measurement technique, not appreciably changed, favor postsurgical scarring. Mild patchy centrilobular ground-glass nodularity throughout both lungs, upper lobe predominant. Musculoskeletal: There is a large destructive lytic lesion replacing much of the left humeral head. No additional aggressive appearing bone lesions in the chest. Moderate to marked thoracic spondylosis. Stable subcutaneous lipoma in the midline back centered at the T6 level. CT ABDOMEN AND PELVIS Hepatobiliary: Normal liver size. Hypodense 0.8 cm posterior right liver lobe lesion is too small to characterize and appears stable  back to 03/18/2012, suggesting a benign lesion. No new definite liver lesions, noting limitation by streak artifact from the patient's  upper extremities. Nondistended gallbladder contains a calcified 2.0 cm gallstone, with no gallbladder wall thickening or pericholecystic fluid. No biliary ductal dilatation. Pancreas: Normal, with no mass or duct dilation. Spleen: Normal size. No mass. Adrenals/Urinary Tract: Normal adrenals. No renal stones. No hydronephrosis. No contour deforming renal mass. Bladder is relatively collapsed by indwelling Foley catheter. No definite bladder wall thickening. Stomach/Bowel: Grossly normal stomach. Normal caliber small bowel with no small bowel wall thickening. Appendectomy. Normal large bowel with no diverticulosis, large bowel wall thickening or pericolonic fat stranding. Vascular/Lymphatic: Atherosclerotic nonaneurysmal abdominal aorta. No pathologically enlarged lymph nodes in the abdomen or pelvis. Reproductive: Stable mild prostatomegaly. Other: No pneumoperitoneum, ascites or focal fluid collection. Musculoskeletal: No aggressive appearing focal osseous lesions. Marked lumbar spondylosis. IMPRESSION: 1. 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 2. No additional potential sites of metastatic disease in the chest, abdomen or pelvis on this motion degraded noncontrast CT study. 3. Stable nodular focus of consolidation adjacent to the wedge resection suture line in the right upper lobe, favor nodular scarring. Continued attention on follow-up chest CT is advised. 4. Large volume of secretions in the central airways as described, probably representing aspirated material. Patchy tree-in-bud opacities in the dependent lower lobes bilaterally, consistent with mild aspiration pneumonitis. 5. Patchy ground-glass centrilobular nodularity in both lungs, which could represent smoking related interstitial lung disease (respiratory bronchiolitis) if the patient is a current smoker. 6. Additional findings include aortic atherosclerosis, left main and 3  vessel coronary atherosclerosis, cholelithiasis and mild prostatomegaly. Electronically Signed   By: Ilona Sorrel M.D.   On: 11/14/2015 14:01   Dg Chest 1 View  11/15/2015  CLINICAL DATA:  Aspiration pneumonitis. History of lung carcinoma. Hypertension. EXAM: CHEST 1 VIEW COMPARISON:  Chest radiograph and chest CT November 14, 2015 FINDINGS: Extensive bony destruction is noted in the distal left clavicle and proximal humeral regions. There is no edema or consolidation. The heart size and pulmonary vascular normal. Aorta is prominent with calcification in the arch region, stable. Port-A-Cath tip is in the superior vena cava. No pneumothorax. No adenopathy evident. There is arthropathy in the right shoulder. IMPRESSION: Widespread bony destruction involving the left distal clavicle and proximal left humerus consistent with metastatic disease. No parenchymal lung edema or consolidation. Stable cardiac silhouette. Prominence of the aorta may reflect chronic hypertensive change. There is aortic atherosclerosis. Electronically Signed   By: Lowella Grip III M.D.   On: 11/15/2015 08:05   Ct Head Wo Contrast  11/14/2015  CLINICAL DATA:  Increasing confusion EXAM: CT HEAD WITHOUT CONTRAST TECHNIQUE: Contiguous axial images were obtained from the base of the skull through the vertex without intravenous contrast. COMPARISON:  Head CT October 26, 2015 and brain MRI October 27, 2015 FINDINGS: Brain: There is mild diffuse atrophy. There is no intracranial mass, hemorrhage, extra-axial fluid collection, or midline shift. There is extensive small vessel disease throughout the centra semiovale bilaterally. Small vessel disease is noted in the lower pons region in the basilar perforator distribution. There is evidence of a prior small lacunar infarct in the lateral right cerebellum inferiorly, stable. There is small vessel disease throughout the anterior limbs of each external capsule, stable. No acute infarct evident. Vascular: There  are foci of calcification in the cavernous carotid arteries bilaterally. No hyperdense vessels noted. Skull: The bony calvarium appears  intact. Sinuses/Orbits: The orbits appear symmetric bilaterally. The visualized paranasal sinuses are clear. Other: Mastoid air cells are clear. Note that the mastoids on the left are somewhat hypoplastic. IMPRESSION: Atrophy with extensive supratentorial infratentorial small vessel disease. 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 No acute infarct evident. No hemorrhage or mass effect. There are foci of cavernous carotid artery calcification bilaterally. Comment: There is slightly greater atrophy in the posterior medial right occipital lobe compared to its counterpart on the left. This appearance is stable compared to recent CT and MR evaluations. No acute infarct is appreciable in this area, but there does appear to be mild asymmetric small vessel disease in this area on the right, stable. Electronically Signed   By: Lowella Grip III M.D.   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 EXAM: CT CHEST, ABDOMEN AND PELVIS WITHOUT CONTRAST TECHNIQUE: Multidetector CT imaging of the chest, abdomen and pelvis was performed following the standard protocol without IV contrast. COMPARISON:  12/17/2014 PET-CT. 06/25/2015 chest CT. 03/18/2012 CT abdomen. FINDINGS: Mildly motion degraded scan. CT CHEST Mediastinum/Nodes: Normal heart size. Stable trace pericardial fluid/ thickening. Left main, left anterior descending, left circumflex and right coronary atherosclerosis. Right internal jugular MediPort terminates in the lower third of the superior vena cava. Atherosclerotic nonaneurysmal thoracic aorta. Normal  caliber pulmonary arteries. No discrete thyroid nodules. Unremarkable esophagus. No pathologically enlarged axillary, mediastinal or gross hilar lymph nodes, noting limited sensitivity for the detection of hilar adenopathy on this noncontrast study. Lungs/Pleura: No pneumothorax. No pleural effusion. Large volume of secretions fill the right mainstem bronchus, lobar and segmental right upper, right middle and right lower lobe bronchi, and left lower lobe segmental bronchi. Patchy tree-in-bud opacities in the dependent lower lobes bilaterally, most consistent with aspiration pneumonitis. No acute consolidative airspace disease. Irregular nodular 1.8 x 1.7 cm focus of consolidation in the right upper lobe adjacent to the wedge resection suture line (series 4/image 42) previously measured 2.0 x 1.7 cm on 06/25/2015 chest CT using similar measurement technique, not appreciably changed, favor postsurgical scarring. Mild patchy centrilobular ground-glass nodularity throughout both lungs, upper lobe predominant. Musculoskeletal: There is a large destructive lytic lesion replacing much of the left humeral head. No additional aggressive appearing bone lesions in the chest. Moderate to marked thoracic spondylosis. Stable subcutaneous lipoma in the midline back centered at the T6 level. CT ABDOMEN AND PELVIS Hepatobiliary: Normal liver size. Hypodense 0.8 cm posterior right liver lobe lesion is too small to characterize and appears stable back to 03/18/2012, suggesting a benign lesion. No new definite liver lesions, noting limitation by streak artifact from the patient's upper extremities. Nondistended gallbladder contains a calcified 2.0 cm gallstone, with no gallbladder wall thickening or pericholecystic fluid. No biliary ductal dilatation. Pancreas: Normal, with no mass or duct dilation. Spleen: Normal size. No mass. Adrenals/Urinary Tract: Normal adrenals. No renal stones. No hydronephrosis. No contour deforming renal  mass. Bladder is relatively collapsed by indwelling Foley catheter. No definite bladder wall thickening. Stomach/Bowel: Grossly normal stomach. Normal caliber small bowel with no small bowel wall thickening. Appendectomy. Normal large bowel with no diverticulosis, large bowel wall thickening or pericolonic fat stranding. Vascular/Lymphatic: Atherosclerotic nonaneurysmal abdominal aorta. No pathologically enlarged lymph nodes in the abdomen or pelvis.  Reproductive: Stable mild prostatomegaly. Other: No pneumoperitoneum, ascites or focal fluid collection. Musculoskeletal: No aggressive appearing focal osseous lesions. Marked lumbar spondylosis. IMPRESSION: 1. 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 2. No additional potential sites of metastatic disease in the chest, abdomen or pelvis on this motion degraded noncontrast CT study. 3. Stable nodular focus of consolidation adjacent to the wedge resection suture line in the right upper lobe, favor nodular scarring. Continued attention on follow-up chest CT is advised. 4. Large volume of secretions in the central airways as described, probably representing aspirated material. Patchy tree-in-bud opacities in the dependent lower lobes bilaterally, consistent with mild aspiration pneumonitis. 5. Patchy ground-glass centrilobular nodularity in both lungs, which could represent smoking related interstitial lung disease (respiratory bronchiolitis) if the patient is a current smoker. 6. Additional findings include aortic atherosclerosis, left main and 3 vessel coronary atherosclerosis, cholelithiasis and mild prostatomegaly. Electronically Signed   By: Ilona Sorrel M.D.   On: 11/14/2015 14:01   Dg Chest Port 1 View  11/14/2015  CLINICAL DATA:  Acute onset of generalized chest pain. Initial encounter. EXAM: PORTABLE CHEST 1 VIEW COMPARISON:  Chest radiograph performed 10/27/2015 FINDINGS: The lungs are  well-aerated. Postoperative scarring is noted at the right lung apex. There is no evidence of focal opacification, pleural effusion or pneumothorax. The cardiomediastinal silhouette is within normal limits. There is persistent destruction of the distal left clavicle, similar in appearance to the recent prior study. Degenerative change is noted about the right acromion. A right-sided chest port is noted ending about the distal SVC. IMPRESSION: 1. Destruction of the distal clavicle, similar in appearance to the recent prior study, reflecting the patient's known metastatic carcinoma. 2. Postoperative scarring at the right lung apex. Lungs otherwise grossly clear. Electronically Signed   By: Garald Balding M.D.   On: 11/14/2015 06:27    Assessment & Plan:   # 70 year old African-American male patient with multiple medical problems- currently admitted to the hospital for hypercalcemia/left shoulder lytic lesion  # Left shoulder lytic lesion/hypercalcemia- status post bisphosphonate. Highly suspicious for malignancy- question recurrent head and neck cancer versus lung cancer versus new primary.   # History of lung cancer/ history of head and neck cancer- question recurrence.  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 In general I discussed the further workup will include further imaging/biopsy- and chemotherapy based upon the pathology. However, given patient's current clinical status/ poor baseline performance status- patient is not a candidate for any treatment for his underlying malignancy. Hence I would not recommend any aggressive workup at this time. Recommend palliative care evaluation/hospice. Patient's daughter is awaiting a conference phone call with the social worker tomorrow  morning.   Cammie Sickle, MD 11/15/2015  7:00 PM

## 2015-11-15 NOTE — Care Management Important Message (Signed)
Important Message  Patient Details  Name: ULYSSE SIEMEN MRN: [id number] Date of Birth: [birth year]   Medicare Important Message Given:  Yes    Shelbie Ammons, RN 11/15/2015, 9:19 AM

## 2015-11-15 NOTE — Progress Notes (Signed)
Dr. Estanislado Pandy notified lactic acid is 2.3, order for another lactic acid in 3 hours. Notified the last lactic acid lab was able to draw was 24 hours ago. Notified calcium is 13.0, continue to monitor.

## 2015-11-16 DIAGNOSIS — Z515 Encounter for palliative care: Secondary | ICD-10-CM

## 2015-11-16 DIAGNOSIS — C3490 Malignant neoplasm of unspecified part of unspecified bronchus or lung: Secondary | ICD-10-CM

## 2015-11-16 DIAGNOSIS — Z66 Do not resuscitate: Secondary | ICD-10-CM

## 2015-11-16 LAB — BASIC METABOLIC PANEL
ANION GAP: 7 (ref 5–15)
BUN: 24 mg/dL — AB (ref 6–20)
CALCIUM: 11.1 mg/dL — AB (ref 8.9–10.3)
CHLORIDE: 117 mmol/L — AB (ref 101–111)
CO2: 25 mmol/L (ref 22–32)
CREATININE: 1.65 mg/dL — AB (ref 0.61–1.24)
GFR calc non Af Amer: 40 mL/min — ABNORMAL LOW (ref >60)
GFR, EST AFRICAN AMERICAN: 47 mL/min — AB (ref >60)
Glucose, Bld: 90 mg/dL (ref 65–99)
Potassium: 3.4 mmol/L — ABNORMAL LOW (ref 3.5–5.1)
SODIUM: 149 mmol/L — AB (ref 135–145)

## 2015-11-16 LAB — MAGNESIUM: MAGNESIUM: 1.4 mg/dL — AB (ref 1.7–2.4)

## 2015-11-16 MED ORDER — MORPHINE SULFATE (PF) 2 MG/ML IV SOLN: 2.0000 mg | Freq: Four times a day (QID) | INTRAVENOUS | Status: DC | PRN

## 2015-11-16 MED ORDER — LORAZEPAM 2 MG/ML IJ SOLN
1.0000 mg | INTRAMUSCULAR | Status: DC | PRN
  Administered 2015-11-16: 1 mg via INTRAVENOUS
  Filled 2015-11-16: qty 1

## 2015-11-16 MED ORDER — LORAZEPAM 2 MG/ML IJ SOLN: 1.0000 mg | INTRAMUSCULAR | Status: AC | PRN

## 2015-11-16 MED ORDER — MORPHINE SULFATE (PF) 2 MG/ML IV SOLN: 1.0000 mg | INTRAVENOUS | Status: AC | PRN

## 2015-11-16 MED ORDER — MORPHINE SULFATE (PF) 2 MG/ML IV SOLN
1.0000 mg | INTRAVENOUS | Status: DC | PRN
  Administered 2015-11-16 (×3): 1 mg via INTRAVENOUS
  Filled 2015-11-16 (×3): qty 1

## 2015-11-16 NOTE — Progress Notes (Signed)
Pt had 23 runs of V Tach, VSS. Patient is more alert than previously. MD notified, continue to monitor.

## 2015-11-16 NOTE — Clinical Social Work Note (Signed)
CSW was consulted for residential hospice. CSW spoke with pt's cousin to regarding choice. [facility] was chosen. CSW made referral to [facility]. CSW updated [employer]. CSW will continue to follow.   Darden Dates, MSW, LCSW  Clinical Social Worker  [phone]

## 2015-11-16 NOTE — Clinical Social Work Note (Addendum)
Pt is ready for discharge to [facility]. Pt's cousin is aware and agreeable to discharge plan. He will be meeting pt over at that [place] [facility] today. Report will be called and [facility] will provide transportation. CSW is signing off as no further needs identified.   Darden Dates, MSW, LCSW  Clinical Social Worker [phone]  Addendum: CSW also updated pt's daughter, Tilda Burrow, of discharge plan and she is in agreement.

## 2015-11-16 NOTE — Progress Notes (Signed)
Ems here to transport pt at this time.  Med given prior to transport

## 2015-11-16 NOTE — Progress Notes (Signed)
Wadie Lessen np in to speak with pts family re plans and goals.  Pt  Made comfort care.  Pt to go to hospice home via ems.  Suctioned of secretions x2. Med for  Moaning given and restlessness.waiting on ems to transport.

## 2015-11-16 NOTE — Consult Note (Signed)
   [facility] CM Inpatient Consult   11/16/2015  Vernon Hayes 28-Dec-1945 [id number]   Patient screened for potential [facility] services. Patient is eligible for [facility]. Electronic medical record reveals patient's discharge plan is hospice care. [facility] Care Management services not appropriate at this time. If patient's post hospital needs change please place a [facility] Care Management consult. For questions please contact:   Sanaii Caporaso RN, [facility] Liaison  [phone]) [employer] [phone]) Toll free office

## 2015-11-16 NOTE — Progress Notes (Signed)
Pt's cousin states that patient has declined rapidly in the last three weeks. Last time she saw him, he was walking at the facility and complaining of left shoulder pain. Pt's cousin lives in [place].

## 2015-11-16 NOTE — Discharge Summary (Signed)
[facility] [place] NAME: Vernon Hayes    MR#:  [id number]  DATE OF BIRTH:  1945-07-31  DATE OF ADMISSION:  11/14/2015 ADMITTING PHYSICIAN: Bettey Costa, MD  DATE OF DISCHARGE: 11/16/2015  PRIMARY CARE PHYSICIAN: MASOUD,JAVED, MD    ADMISSION DIAGNOSIS:  Hypercalcemia [E83.52] Delirium [R41.0] Chest pain [R07.9]  DISCHARGE DIAGNOSIS:  Active Problems:   Hypercalcemia   Lung cancer ([place])   DNR (do not resuscitate)   Palliative care encounter   SECONDARY DIAGNOSIS:   Past Medical History  Diagnosis Date  . GERD (gastroesophageal reflux disease)   . Heart attack ([place]) 1982  . Hypertension   . 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 Squamous cell carcinoma of right lung ([place]) 12/28/14    Wedge resection on 01/19/15, pathology reports 1.2 cm squamous cell carcinoma with negative margins  . History of chemotherapy     Completed cycle 2 chemo with Carboplatin/5FU on 12/26/11.  Marland Kitchen History of radiation therapy   . Vertigo   . Loss of appetite   . ETOH abuse     HOSPITAL COURSE:   70 year old male with history of squamous RU lung cancer and locally advanced head and neck cancer currently not on chemotherapy followed by oncology who presents with altered mental status with baseline dementia.  1. Acute metabolic encephalopathy: This wasdue to hypercalcemia. 2. Hypercalcemia likley from metastatic disease: he was treated with Zometa and IV fluids.  3. Essential hypertension: he was on IV metoprolol 4. Hypokalemia: repleted  5. Lung cancer and head and neck cancer with metastatic disease on CT scan: patient hasVERY POOR overall prognosis. Patient would benefit from hospice. Dr. Renford Dills with family about patient's poor prognosis and poor baseline performance status and he would not be a candidate for treatment due to this. After consultation palliative care  family has decided on comfort care and hospice home.  6. Aspiration PNA: he was initially started on Zosyn.  DISCHARGE CONDITIONS AND DIET:   Guarded condition and diet as tolerated  CONSULTS OBTAINED:  Treatment Team:  Evlyn Kanner, NP  DRUG ALLERGIES:   Allergies  Allergen Reactions  . Tetracyclines & Related Hives    DISCHARGE MEDICATIONS:   Current Discharge Medication List    START taking these medications   Details  LORazepam (ATIVAN) 2 MG/ML injection Inject 0.5 mLs (1 mg total) into the vein every 4 (four) hours as needed for anxiety. Qty: 1 mL, Refills: 0    morphine 2 MG/ML injection Inject 0.5 mLs (1 mg total) into the vein every hour as needed (dyspnea). Qty: 1 mL, Refills: 0      STOP taking these medications     aspirin EC 81 MG EC tablet      docusate sodium (COLACE) 100 MG capsule      gabapentin (NEURONTIN) 300 MG capsule      hydrochlorothiazide (HYDRODIURIL) 25 MG tablet      HYDROcodone-acetaminophen (NORCO/VICODIN) 5-325 MG tablet      lamoTRIgine (LAMICTAL) 25 MG tablet      meclizine (ANTIVERT) 25 MG tablet      metoprolol tartrate (LOPRESSOR) 25 MG tablet      polyethylene glycol (MIRALAX / GLYCOLAX) packet               Today   CHIEF COMPLAINT:   Continues to have fevers   VITAL SIGNS:  Blood pressure 155/76,  pulse 87, temperature 99.5 F (37.5 C), temperature source Axillary, resp. rate 16, height '6\' 1"'$  (1.854 m), weight 65.772 kg (145 lb), SpO2 100 %.   REVIEW OF SYSTEMS:  Review of Systems  Unable to perform ROS    PHYSICAL EXAMINATION:  GENERAL:  70 y.o.-year-old patient lying in the bed critically ill-appearing NECK:  Supple, no jugular venous distention. No thyroid enlargement, no tenderness.  LUNGS: Normal breath sounds bilaterally, no wheezing, rales,rhonchi  No use of accessory muscles of respiration.  CARDIOVASCULAR: tachycardia No murmurs, rubs, or gallops.  ABDOMEN: Soft, non-tender,  non-distended. Bowel sounds present. No organomegaly or mass.  EXTREMITIES: No pedal edema, cyanosis, or clubbing.  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 EXAM: CT CHEST, ABDOMEN AND PELVIS WITHOUT CONTRAST TECHNIQUE: Multidetector CT imaging of the chest, abdomen and pelvis was performed following the standard protocol without IV contrast. COMPARISON:  12/17/2014 PET-CT. 06/25/2015 chest CT. 03/18/2012 CT abdomen. FINDINGS: Mildly motion degraded scan. CT CHEST Mediastinum/Nodes: Normal heart size. Stable trace pericardial fluid/ thickening. Left main, left anterior descending, left circumflex and right coronary atherosclerosis. Right internal jugular MediPort terminates in the lower third of the superior vena cava. Atherosclerotic nonaneurysmal thoracic aorta. Normal caliber pulmonary arteries. No discrete thyroid nodules. Unremarkable esophagus. No pathologically enlarged axillary, mediastinal or gross hilar lymph nodes, noting limited sensitivity for the detection of hilar adenopathy on this noncontrast study. Lungs/Pleura: No pneumothorax. No pleural effusion. Large volume of secretions fill the right mainstem bronchus, lobar and segmental right upper, right middle and right lower lobe bronchi, and left  lower lobe segmental bronchi. Patchy tree-in-bud opacities in the dependent lower lobes bilaterally, most consistent with aspiration pneumonitis. No acute consolidative airspace disease. Irregular nodular 1.8 x 1.7 cm focus of consolidation in the right upper lobe adjacent to the wedge resection suture line (series 4/image 42) previously measured 2.0 x 1.7 cm on 06/25/2015 chest CT using similar measurement technique, not appreciably changed, favor postsurgical scarring. Mild patchy centrilobular ground-glass nodularity throughout both lungs, upper lobe predominant. Musculoskeletal: There is a large destructive lytic lesion replacing much of the left humeral head. No additional aggressive appearing bone lesions in the chest. Moderate to marked thoracic spondylosis. Stable subcutaneous lipoma in the midline back centered at the T6 level. CT ABDOMEN AND PELVIS Hepatobiliary: Normal liver size. Hypodense 0.8 cm posterior right liver lobe lesion is too small to characterize and appears stable back to 03/18/2012, suggesting a benign lesion. No new definite liver lesions, noting limitation by streak artifact from the patient's upper extremities. Nondistended gallbladder contains a calcified 2.0 cm gallstone, with no gallbladder wall thickening or pericholecystic fluid. No biliary ductal dilatation. Pancreas: Normal, with no mass or duct dilation. Spleen: Normal size.  No mass. Adrenals/Urinary Tract: Normal adrenals. No renal stones. No hydronephrosis. No contour deforming renal mass. Bladder is relatively collapsed by indwelling Foley catheter. No definite bladder wall thickening. Stomach/Bowel: Grossly normal stomach. Normal caliber small bowel with no small bowel wall thickening. Appendectomy. Normal large bowel with no diverticulosis, large bowel wall thickening or pericolonic fat stranding. Vascular/Lymphatic: Atherosclerotic nonaneurysmal abdominal aorta. No pathologically enlarged lymph nodes in the abdomen or  pelvis. Reproductive: Stable mild prostatomegaly. Other: No pneumoperitoneum, ascites or focal fluid collection. Musculoskeletal: No aggressive appearing focal osseous lesions. Marked lumbar spondylosis. IMPRESSION: 1. 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 2. No additional potential sites of metastatic disease in the chest, abdomen or pelvis on this motion degraded noncontrast CT study. 3. Stable nodular focus of consolidation adjacent to the wedge resection suture line in the right upper lobe, favor nodular scarring. Continued attention on follow-up chest CT is advised. 4. Large volume of secretions in the central airways as described, probably representing aspirated material. Patchy tree-in-bud opacities in the dependent lower lobes bilaterally, consistent with mild aspiration pneumonitis. 5. Patchy ground-glass centrilobular nodularity in both lungs, which could represent smoking related interstitial lung disease (respiratory bronchiolitis) if the patient is a current smoker. 6. Additional findings include aortic atherosclerosis, left main and 3 vessel coronary atherosclerosis, cholelithiasis and mild prostatomegaly. Electronically Signed   By: Ilona Sorrel M.D.   On: 11/14/2015 14:01   Dg Chest 1 View  11/15/2015  CLINICAL DATA:  Aspiration pneumonitis. History of lung carcinoma. Hypertension. EXAM: CHEST 1 VIEW COMPARISON:  Chest radiograph and chest CT November 14, 2015 FINDINGS: Extensive bony destruction is noted in the distal left clavicle and proximal humeral regions. There is no edema or consolidation. The heart size and pulmonary vascular normal. Aorta is prominent with calcification in the arch region, stable. Port-A-Cath tip is in the superior vena cava. No pneumothorax. No adenopathy evident. There is arthropathy in the right shoulder. IMPRESSION: Widespread bony destruction involving the left distal clavicle and proximal  left humerus consistent with metastatic disease. No parenchymal lung edema or consolidation. Stable cardiac silhouette. Prominence of the aorta may reflect chronic hypertensive change. There is aortic atherosclerosis. Electronically Signed   By: Lowella Grip III M.D.   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 EXAM: CT CHEST, ABDOMEN AND PELVIS WITHOUT CONTRAST TECHNIQUE: Multidetector CT imaging of the chest, abdomen and pelvis was performed following the standard protocol without IV contrast. COMPARISON:  12/17/2014 PET-CT. 06/25/2015 chest CT. 03/18/2012 CT abdomen. FINDINGS: Mildly motion degraded scan. CT CHEST Mediastinum/Nodes: Normal heart size. Stable trace pericardial fluid/ thickening. Left main, left anterior descending, left circumflex and right coronary atherosclerosis. Right internal jugular MediPort terminates in the lower third of the superior vena cava. Atherosclerotic nonaneurysmal thoracic aorta. Normal caliber pulmonary arteries. No discrete thyroid nodules. Unremarkable esophagus. No pathologically enlarged axillary, mediastinal or gross hilar lymph nodes, noting limited sensitivity for the detection of hilar adenopathy on this noncontrast study. Lungs/Pleura: No pneumothorax. No pleural effusion. Large volume of secretions fill the right mainstem bronchus, lobar and segmental right upper, right middle and right lower lobe bronchi, and left lower lobe segmental bronchi. Patchy tree-in-bud opacities in the dependent lower lobes bilaterally,  most consistent with aspiration pneumonitis. No acute consolidative airspace disease. Irregular nodular 1.8 x 1.7 cm focus of consolidation in the right upper lobe adjacent to the wedge resection suture  line (series 4/image 42) previously measured 2.0 x 1.7 cm on 06/25/2015 chest CT using similar measurement technique, not appreciably changed, favor postsurgical scarring. Mild patchy centrilobular ground-glass nodularity throughout both lungs, upper lobe predominant. Musculoskeletal: There is a large destructive lytic lesion replacing much of the left humeral head. No additional aggressive appearing bone lesions in the chest. Moderate to marked thoracic spondylosis. Stable subcutaneous lipoma in the midline back centered at the T6 level. CT ABDOMEN AND PELVIS Hepatobiliary: Normal liver size. Hypodense 0.8 cm posterior right liver lobe lesion is too small to characterize and appears stable back to 03/18/2012, suggesting a benign lesion. No new definite liver lesions, noting limitation by streak artifact from the patient's upper extremities. Nondistended gallbladder contains a calcified 2.0 cm gallstone, with no gallbladder wall thickening or pericholecystic fluid. No biliary ductal dilatation. Pancreas: Normal, with no mass or duct dilation. Spleen: Normal size. No mass. Adrenals/Urinary Tract: Normal adrenals. No renal stones. No hydronephrosis. No contour deforming renal mass. Bladder is relatively collapsed by indwelling Foley catheter. No definite bladder wall thickening. Stomach/Bowel: Grossly normal stomach. Normal caliber small bowel with no small bowel wall thickening. Appendectomy. Normal large bowel with no diverticulosis, large bowel wall thickening or pericolonic fat stranding. Vascular/Lymphatic: Atherosclerotic nonaneurysmal abdominal aorta. No pathologically enlarged lymph nodes in the abdomen or pelvis. Reproductive: Stable mild prostatomegaly. Other: No pneumoperitoneum, ascites or focal fluid collection. Musculoskeletal: No aggressive appearing focal osseous lesions. Marked lumbar spondylosis. IMPRESSION: 1. 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 2. No additional potential sites of metastatic disease in the chest, abdomen or pelvis on this motion degraded noncontrast CT study. 3. Stable nodular focus of consolidation adjacent to the wedge resection suture line in the right upper lobe, favor nodular scarring. Continued attention on follow-up chest CT is advised. 4. Large volume of secretions in the central airways as described, probably representing aspirated material. Patchy tree-in-bud opacities in the dependent lower lobes bilaterally, consistent with mild aspiration pneumonitis. 5. Patchy ground-glass centrilobular nodularity in both lungs, which could represent smoking related interstitial lung disease (respiratory bronchiolitis) if the patient is a current smoker. 6. Additional findings include aortic atherosclerosis, left main and 3 vessel coronary atherosclerosis, cholelithiasis and mild prostatomegaly. Electronically Signed   By: Ilona Sorrel M.D.   On: 11/14/2015 14:01      Stable for discharge hospice home  Patient should follow up with hospice  CODE STATUS:     Code Status Orders        Start     Ordered   11/16/15 0844  Do not attempt resuscitation (DNR)   Continuous    Question Answer Comment  In the event of cardiac or respiratory ARREST Do not call a "code blue"   In the event of cardiac or respiratory ARREST Do not perform Intubation, CPR, defibrillation or ACLS   In the event of cardiac or respiratory ARREST Use medication by any route, position, wound care, and other measures to relive pain and suffering. May use oxygen, suction and manual treatment of airway obstruction as needed for comfort.      11/16/15 0843    Code Status History    Date Active Date Inactive Code Status Order ID Comments User Context   11/14/2015  9:51 AM 11/16/2015  8:43 AM Full Code [id number]  Bettey Costa, MD Inpatient   10/26/2015 10:57 PM 10/26/2015 10:57 PM Full Code [id number]  Alesia Richards, MD ED    10/26/2015 10:57 PM 10/28/2015  6:58 PM Full Code [id number]  Alesia Richards, MD ED   09/25/2015  1:27 PM 09/28/2015  5:26 PM Full Code [id number]  Hillary Bow, MD ED   01/19/2015  3:47 PM 01/20/2015 11:13 AM Full Code [id number]  Nestor Lewandowsky, MD Inpatient    Advance Directive Documentation        Most Recent Value   Type of Advance Directive  Healthcare Power of Attorney   Pre-existing out of facility DNR order (yellow form or pink MOST form)     "MOST" Form in Place?        TOTAL TIME TAKING CARE OF THIS PATIENT: 35 minutes.    Note: This dictation was prepared with Dragon dictation along with smaller phrase technology. Any transcriptional errors that result from this process are unintentional.  Alejos Reinhardt M.D on 11/16/2015 at 10:29 AM  Between 7am to 6pm - Pager - [phone] After 6pm go to [email or web address] - password [email or web address] [facility]  Office  [phone]  CC: Primary care physician; Cletis Athens, MD

## 2015-11-16 NOTE — Progress Notes (Signed)
   11/16/15 1000  Clinical Encounter Type  Visited With Patient and family together  Visit Type Initial  Referral From Nurse  Consult/Referral To Chaplain  Spiritual Encounters  Spiritual Needs Prayer  Stress Factors  Patient Stress Factors Health changes  Family Stress Factors None identified  Visited with patient for "end-of-life" requisition. Patient appeared to sleep, but I visited with his nephew. Provided pastoral care and prayer.  Chap. Phuong Moffatt G. Fromberg

## 2015-11-16 NOTE — Progress Notes (Signed)
[place] referral received from [place] following a Palliative Medicine Consult with NP Wadie Lessen. Vernon Hayes was admitted to [facility] from [facility] on 7/9 for treatment of hypercalcemia. He has a known history of squamous cell lung cancer,  head and neck cancer, now with a lytic lesion to his left shoulder suspicious for malignancy. He has been seen by oncology and is not a candidate for further treatment. Family has chosen after discussion with Palliative NP to focus on comfort at the hospice home. Vernon Hayes is requiring IV morphine for pain control, he calls out when repositioned. On assessment he is nonverbal, alert to name, does not follow commands.Writer spoke with patient's cousin Vira Agar via telephone to initiate education regarding hospice services, philosophy and team approach to care with good understanding. Plan is for discharge to the hospice home today. Hospital Care team and family in agreement. Signed portable DNR in place in patient's chart. Patient information faxed to referral, report called to the hospice home. EMS notified for transport. Thank you for the opportunity to be involved in the care of this patient and his family. Flo Shanks RN, BSN, [facility] [place], hospital Liaison [phone] c

## 2015-11-16 NOTE — Consult Note (Signed)
Consultation Note Date: 11/16/2015   Patient Name: Vernon Hayes  DOB: 30-May-1945  MRN: [id number]  Age / Sex: 70 y.o., male  PCP: Cletis Athens, MD Referring Physician: Bettey Costa, MD  Reason for Consultation: Establishing goals of care, Non pain symptom management, Pain control and Psychosocial/spiritual support  HPI/Patient Profile: 70 y.o. male  admitted on 11/14/2015 with Vernon Hayes is a 70 y.o. 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 The patient is followed by Dr. Rogue Bussing  Now with hypercalcemia, new shoulder lytic lesion concern for disease progression within context of rapid failure to thrive.  Minimal po intake, non verbal.   Poor proognosis, per oncology not a candidate for further diagnostics or cancer treatments 2/2 to physical and functional status.  Family faced with advanced directive decisions and anticipatory needs.    Clinical Assessment and Goals of Care:   This NP Wadie Lessen reviewed medical records, received report from team, assessed the patient and then meet at the patient's bedside along with his cousin Othman Masur and daughter Tilda Burrow Adrini/ by telephone  to discuss diagnosis prognosis, [place], EOL wishes disposition and options.  A detailed discussion was had today regarding advanced directives.  Concepts specific to code status, artifical feeding and hydration, continued IV antibiotics and rehospitalization was had.  The difference between a aggressive medical intervention path  and a palliative comfort care path for this patient at this time was had.  Values and goals of care important to patient and family were  attempted to be elicited.  Concept of Hospice and Palliative Care were discussed  Natural trajectory and expectations at EOL were discussed.  Questions and concerns addressed.   Family encouraged to call with questions or concerns.  PMT will continue to support holistically.    Primary decision maker is next of kin, daughter Tilda Burrow Adrini who lives I believe in [place].  They have a long distance, limited relationship.  She depends on [place] [employer]/cousin for local assistance, both can be reached at numbers in the chart      SUMMARY OF RECOMMENDATIONS    Code Status/Advance Care Planning:  DNR- documented    Symptom Management:   Pain/Dyspnea:  Morphine 1 mg IV every 1 hr prn  Agitation:  Ativan 1 mg IV every 4 hrs prn  Palliative Prophylaxis:   Aspiration, Bowel Regimen, Frequent Pain Assessment and Oral Care  Additional Recommendations (Limitations, Scope, Preferences):  Full Comfort Care  Psycho-social/Spiritual:   Desire for further Chaplaincy support:yes  Additional Recommendations: Education on Hospice  Prognosis:   < 2 weeks  Discharge Planning: Hospice facility      Primary Diagnoses: Present on Admission:  . Hypercalcemia  I have reviewed the medical record, interviewed the patient and family, and examined the patient. The following aspects  are pertinent.  Past Medical History  Diagnosis Date  . GERD (gastroesophageal reflux disease)   . Heart attack ([facility]) 1982  . Hypertension   . 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 Squamous cell carcinoma of right lung ([facility]) 12/28/14    Wedge resection on 01/19/15, pathology reports 1.2 cm squamous cell carcinoma with negative margins  . History of chemotherapy     Completed cycle 2 chemo with Carboplatin/5FU on 12/26/11.  Marland Kitchen History of radiation therapy   . Vertigo   . Loss of appetite   . ETOH abuse    Social History   Social  History  . Marital Status: Single    Spouse Name: N/A  . Number of Children: N/A  . Years of Education: N/A   Social History Main Topics  . Smoking status: Current Every Day Smoker -- 0.25 packs/day for 50 years    Types: Cigarettes  . Smokeless tobacco: Former Systems developer    Types: Chew     Comment: I used to chew tobacco when I played baseball.  . Alcohol Use: 0.0 oz/week    0 Glasses of wine, 0 Cans of beer, 0 Standard drinks or equivalent per week     Comment: drinks daily  . Drug Use: No  . Sexual Activity: Not Asked   Other Topics Concern  . None   Social History Narrative   Family History  Problem Relation Age of Onset  . Throat cancer Brother   . Cirrhosis Maternal Grandmother   . Lung cancer Maternal Aunt   . Stroke Mother   . Stroke Maternal Grandfather    Scheduled Meds: . antiseptic oral rinse  7 mL Mouth Rinse q12n4p  . aspirin EC  81 mg Oral Daily  . chlorhexidine  15 mL Mouth Rinse BID  . docusate sodium  100 mg Oral BID  . enoxaparin (LOVENOX) injection  40 mg Subcutaneous Q24H  . gabapentin  300 mg Oral BID  . lamoTRIgine  50 mg Oral QHS  . metoprolol tartrate  25 mg Oral BID  . piperacillin-tazobactam (ZOSYN)  IV  3.375 g Intravenous Q8H  . sodium chloride flush  3 mL Intravenous Q12H   Continuous Infusions: . sodium chloride 100 mL/hr at 11/16/15 0037   PRN Meds:.acetaminophen **OR** acetaminophen, hydrALAZINE, HYDROcodone-acetaminophen, metoprolol, morphine injection, ondansetron **OR** ondansetron (ZOFRAN) IV, polyethylene glycol, risperiDONE Medications Prior to Admission:  Prior to Admission medications   Medication Sig Start Date End Date Taking? Authorizing Provider  aspirin EC 81 MG EC tablet Take 1 tablet (81 mg total) by mouth daily. 09/28/15  Yes Dustin Flock, MD  docusate sodium (COLACE) 100 MG capsule Take 1 capsule (100 mg total) by mouth 2 (two) times daily. 09/28/15  Yes Dustin Flock, MD  gabapentin (NEURONTIN) 300 MG capsule Take 1  capsule (300 mg total) by mouth 2 (two) times daily. 03/24/15  Yes Cammie Sickle, MD  hydrochlorothiazide (HYDRODIURIL) 25 MG tablet Take 25 mg by mouth daily. 09/12/15  Yes Historical Provider, MD  HYDROcodone-acetaminophen (NORCO/VICODIN) 5-325 MG tablet Take 1-2 tablets by mouth every 4 (four) hours as needed for moderate pain. 09/28/15  Yes Dustin Flock, MD  lamoTRIgine (LAMICTAL) 25 MG tablet Take 25 mg by mouth at bedtime.    Yes Historical Provider, MD  meclizine (ANTIVERT) 25 MG tablet Take 25 mg by mouth every 8 (eight) hours as needed for dizziness.   Yes Historical Provider,  MD  metoprolol tartrate (LOPRESSOR) 25 MG tablet Take 1 tablet (25 mg total) by mouth 2 (two) times daily. 10/28/15  Yes Srikar Sudini, MD  polyethylene glycol (MIRALAX / GLYCOLAX) packet Take 17 g by mouth daily as needed for mild constipation. 09/28/15  Yes Dustin Flock, MD   Allergies  Allergen Reactions  . Tetracyclines & Related Hives   Review of Systems  Unable to perform ROS: Dementia    Physical Exam  Constitutional: He appears lethargic. He appears cachectic. He appears ill.  Cardiovascular: Normal rate, regular rhythm and normal heart sounds.   Pulmonary/Chest: He has decreased breath sounds in the right lower field and the left lower field.  Musculoskeletal:  -generalized weakness with muscle atrophy  Neurological: He appears lethargic.  Skin: Skin is warm and dry.    Vital Signs: BP 155/76 mmHg  Pulse 87  Temp(Src) 99.5 F (37.5 C) (Axillary)  Resp 16  Ht '6\' 1"'$  (1.854 m)  Wt 65.772 kg (145 lb)  BMI 19.13 kg/m2  SpO2 100% Pain Assessment: Faces   Pain Score: 0-No pain   SpO2: SpO2: 100 % O2 Device:SpO2: 100 % O2 Flow Rate: .   IO: Intake/output summary:  Intake/Output Summary (Last 24 hours) at 11/16/15 0749 Last data filed at 11/16/15 0414  Gross per 24 hour  Intake      0 ml  Output    850 ml  Net   -850 ml    LBM: Last BM Date: 11/14/15 Baseline Weight: Weight:  65.772 kg (145 lb) Most recent weight: Weight: 65.772 kg (145 lb)     Palliative Assessment/Data:     Time In: 0730 Time Out: 0845 Time Total: 75 min Greater than 50%  of this time was spent counseling and coordinating care related to the above assessment and plan.  Signed by: Wadie Lessen, NP   Please contact Palliative Medicine Team phone at [phone] for questions and concerns.  For individual provider: See Shea Evans

## 2015-11-21 LAB — CULTURE, BLOOD (ROUTINE X 2)
CULTURE: NO GROWTH
Culture: NO GROWTH

## 2015-12-07 DEATH — deceased

## 2015-12-23 ENCOUNTER — Ambulatory Visit: Payer: Medicare Other | Admitting: Internal Medicine

## 2015-12-23 ENCOUNTER — Other Ambulatory Visit: Payer: Medicare Other

## 2015-12-23 ENCOUNTER — Ambulatory Visit: Payer: Medicare Other

## 2017-02-26 IMAGING — CT CT CHEST W/O CM
2 of 4 series · 12 of 36 positions shown, 15 images · non-contrast
Comparison: 12/17/2014 PET-CT. 06/25/2015 chest CT. 03/18/2012 CT
abdomen.

CLINICAL DATA: 69-year-old male inpatient with T3 and 2 M0 squamous
cell carcinoma of the right tongue base and squamous cell lung
carcinoma of the right upper lobe status post wedge resection
January 2015, presenting for restaging.

EXAM:
CT CHEST, ABDOMEN AND PELVIS WITHOUT CONTRAST
TECHNIQUE: Multidetector CT imaging of the chest, abdomen and pelvis was
performed following the standard protocol without IV contrast.

[Series 2: cap wo · axial · 0.68mm/px · z∈[-1142,-688]mm · 9 of 113 slices shown, 12 images]
[im 11/113  mediastinal]
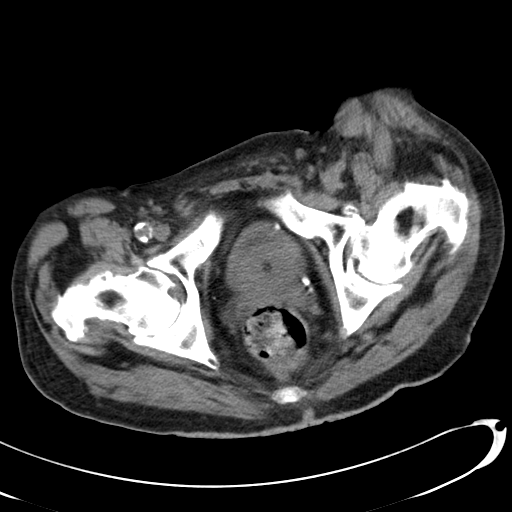
[im 11/113  lung]
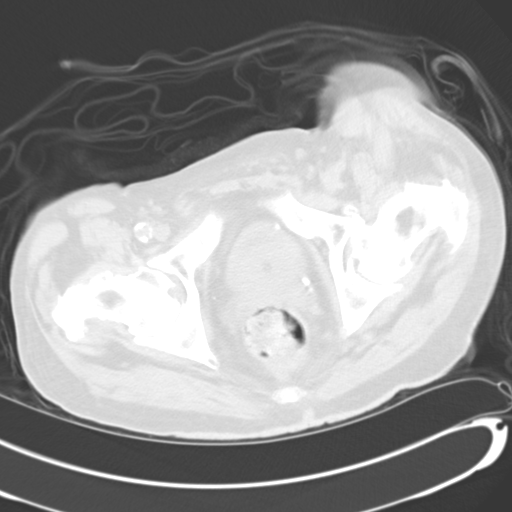
[im 21/113  lung]
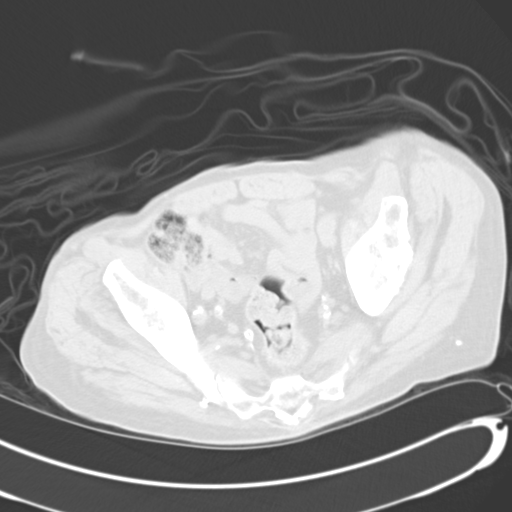
[im 31/113  lung]
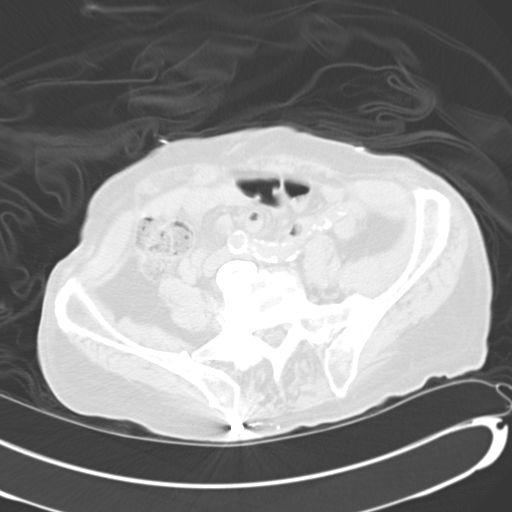
[im 41/113  lung]
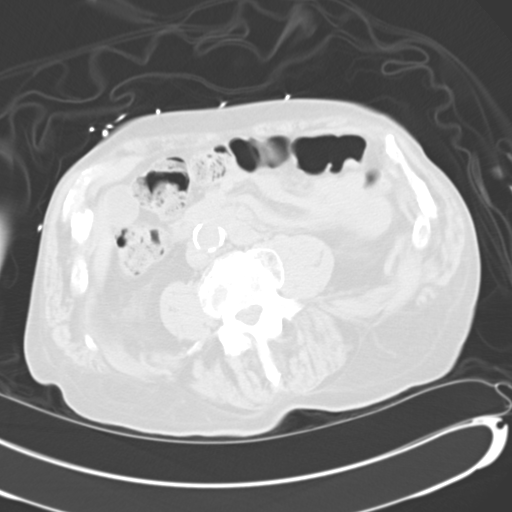
[im 62/113  mediastinal]
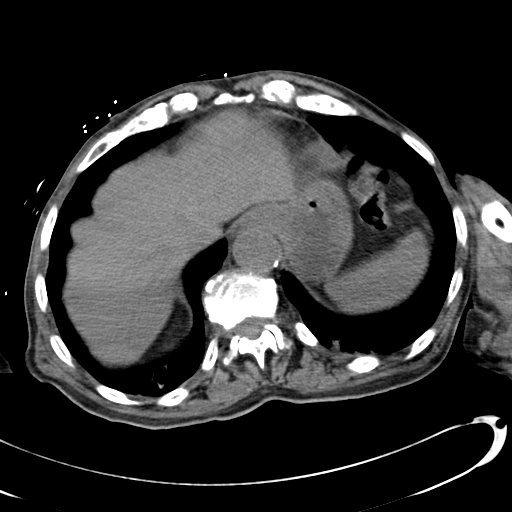
[im 62/113  lung]
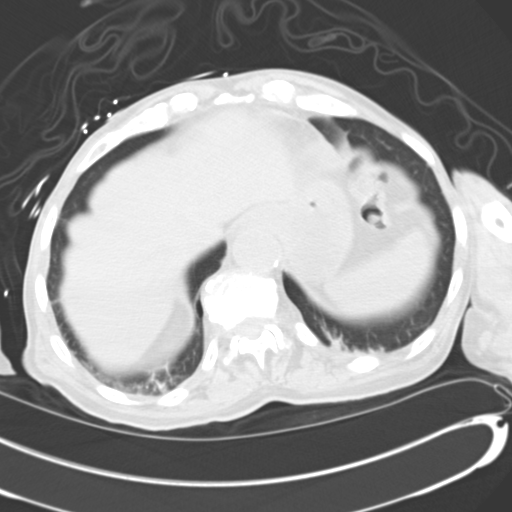
[im 72/113  lung]
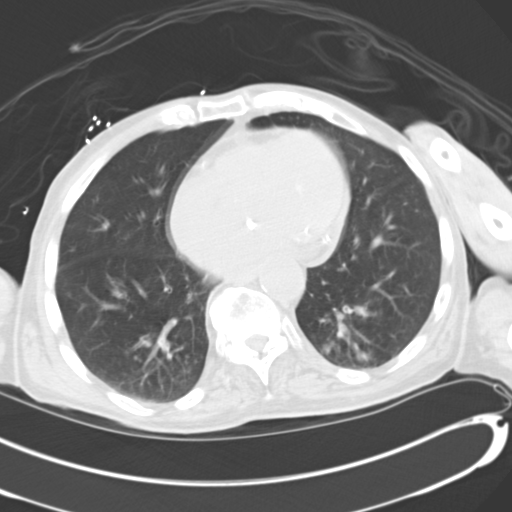
[im 82/113  lung]
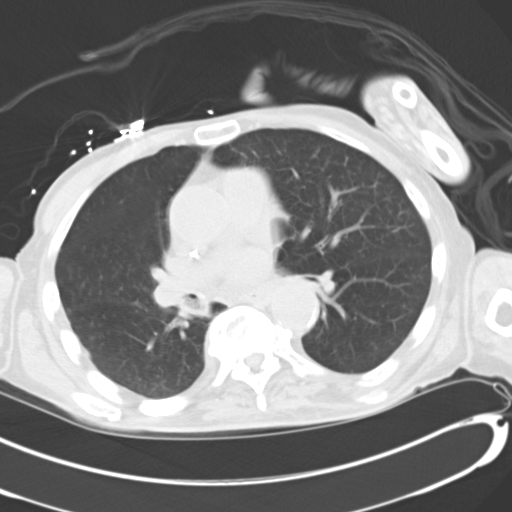
[im 92/113  lung]
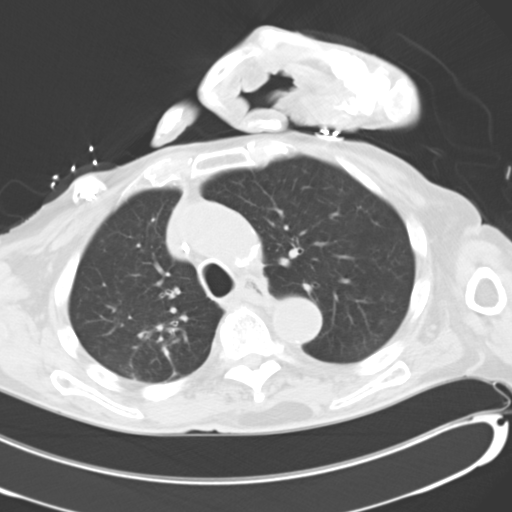
[im 102/113  mediastinal]
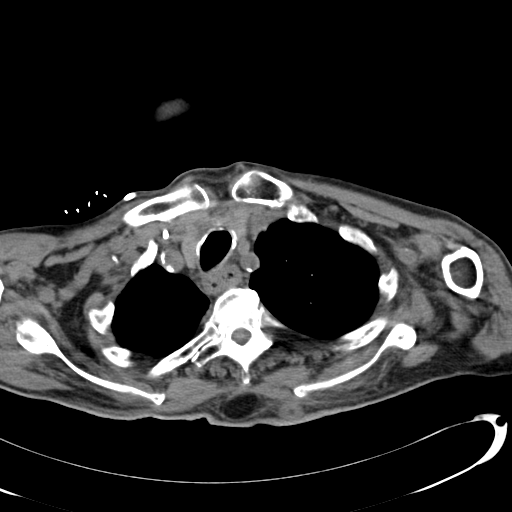
[im 102/113  lung]
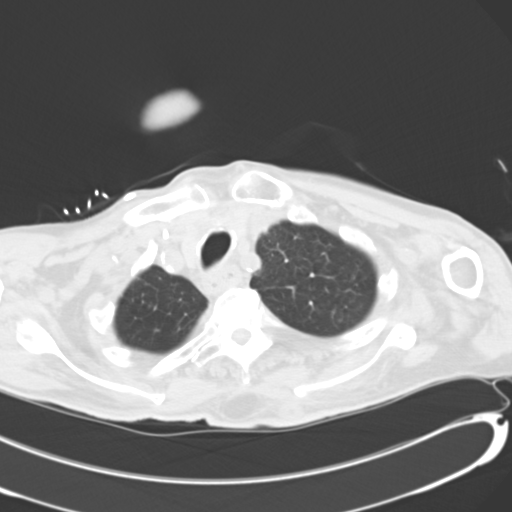

[Series 5: cor cap wo · coronal · 0.65mm/px · 3 of 123 slices shown]
[im 25/123  lung]
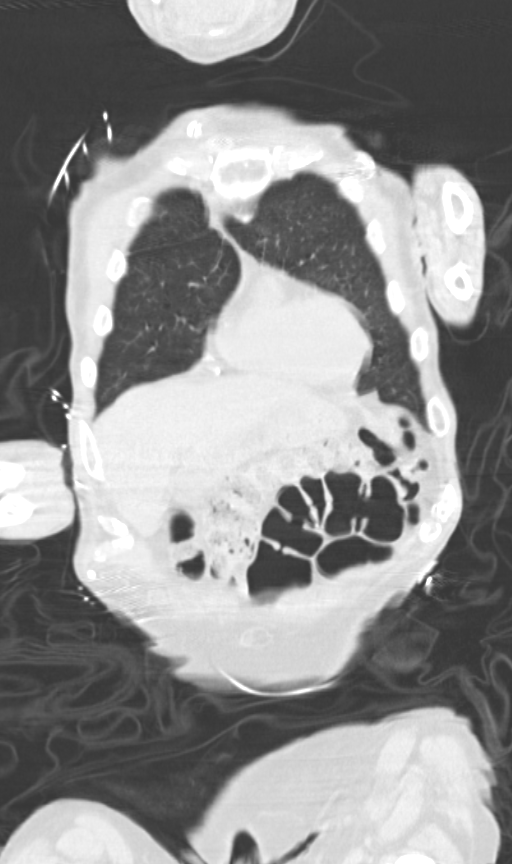
[im 49/123  lung]
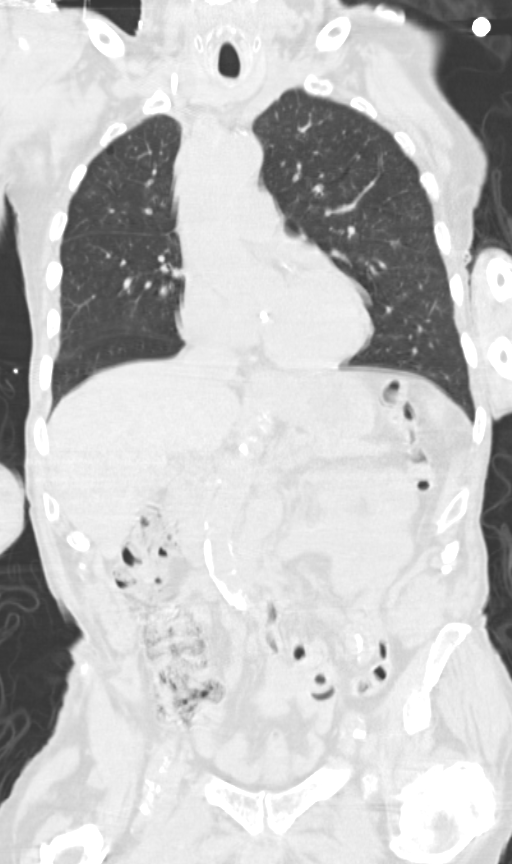
[im 74/123  lung]
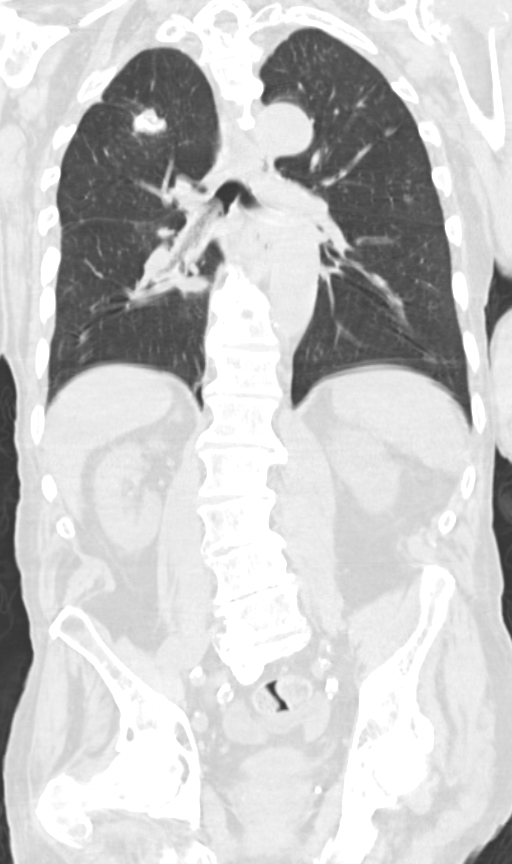

[12 of 36 positions shown; findings below may reference images not displayed]

FINDINGS: Mildly motion degraded scan.

CT CHEST

Mediastinum/Nodes: Normal heart size. Stable trace pericardial
fluid/ thickening. Left main, left anterior descending, left
circumflex and right coronary atherosclerosis. Right internal
jugular MediPort terminates in the lower third of the superior vena
cava. Atherosclerotic nonaneurysmal thoracic aorta. Normal caliber
pulmonary arteries. No discrete thyroid nodules. Unremarkable
esophagus. No pathologically enlarged axillary, mediastinal or gross
hilar lymph nodes, noting limited sensitivity for the detection of
hilar adenopathy on this noncontrast study.

Lungs/Pleura: No pneumothorax. No pleural effusion. Large volume of
secretions fill the right mainstem bronchus, lobar and segmental
right upper, right middle and right lower lobe bronchi, and left
lower lobe segmental bronchi. Patchy tree-in-bud opacities in the
dependent lower lobes bilaterally, most consistent with aspiration
pneumonitis. No acute consolidative airspace disease. Irregular
nodular 1.8 x 1.7 cm focus of consolidation in the right upper lobe
adjacent to the wedge resection suture line (series 4/image 42)
previously measured 2.0 x 1.7 cm on 06/25/2015 chest CT using
similar measurement technique, not appreciably changed, favor
postsurgical scarring. Mild patchy centrilobular ground-glass
nodularity throughout both lungs, upper lobe predominant.

Musculoskeletal: There is a large destructive lytic lesion replacing
much of the left humeral head. No additional aggressive appearing
bone lesions in the chest. Moderate to marked thoracic spondylosis.
Stable subcutaneous lipoma in the midline back centered at the T6
level.

CT ABDOMEN AND PELVIS

Hepatobiliary: Normal liver size. Hypodense 0.8 cm posterior right
liver lobe lesion is too small to characterize and appears stable
back to 03/18/2012, suggesting a benign lesion. No new definite
liver lesions, noting limitation by streak artifact from the
patient's upper extremities. Nondistended gallbladder contains a
calcified 2.0 cm gallstone, with no gallbladder wall thickening or
pericholecystic fluid. No biliary ductal dilatation.

Pancreas: Normal, with no mass or duct dilation.

Spleen: Normal size. No mass.

Adrenals/Urinary Tract: Normal adrenals. No renal stones. No
hydronephrosis. No contour deforming renal mass. Bladder is
relatively collapsed by indwelling Foley catheter. No definite
bladder wall thickening.

Stomach/Bowel: Grossly normal stomach. Normal caliber small bowel
with no small bowel wall thickening. Appendectomy. Normal large
bowel with no diverticulosis, large bowel wall thickening or
pericolonic fat stranding.

Vascular/Lymphatic: Atherosclerotic nonaneurysmal abdominal aorta.
No pathologically enlarged lymph nodes in the abdomen or pelvis.

Reproductive: Stable mild prostatomegaly.

Other: No pneumoperitoneum, ascites or focal fluid collection.

Musculoskeletal: No aggressive appearing focal osseous lesions.
Marked lumbar spondylosis.
IMPRESSION: 1. Large destructive lytic bone lesion replacing much of the left
humeral head, probably representing a bone metastasis.
2. No additional potential sites of metastatic disease in the chest,
abdomen or pelvis on this motion degraded noncontrast CT study.
3. Stable nodular focus of consolidation adjacent to the wedge
resection suture line in the right upper lobe, favor nodular
scarring. Continued attention on follow-up chest CT is advised.
4. Large volume of secretions in the central airways as described,
probably representing aspirated material. Patchy tree-in-bud
opacities in the dependent lower lobes bilaterally, consistent with
mild aspiration pneumonitis.
5. Patchy ground-glass centrilobular nodularity in both lungs, which
could represent smoking related interstitial lung disease
(respiratory bronchiolitis) if the patient is a current smoker.
6. Additional findings include aortic atherosclerosis, left main and
3 vessel coronary atherosclerosis, cholelithiasis and mild
prostatomegaly.

## 2017-02-26 IMAGING — DX DG CHEST 1V PORT
2 series · 2 of 2 positions shown · non-contrast
Comparison: Chest radiograph performed 10/27/2015

CLINICAL DATA: Acute onset of generalized chest pain. Initial
encounter.

EXAM:
PORTABLE CHEST 1 VIEW

[chest ap (1 of 2)]
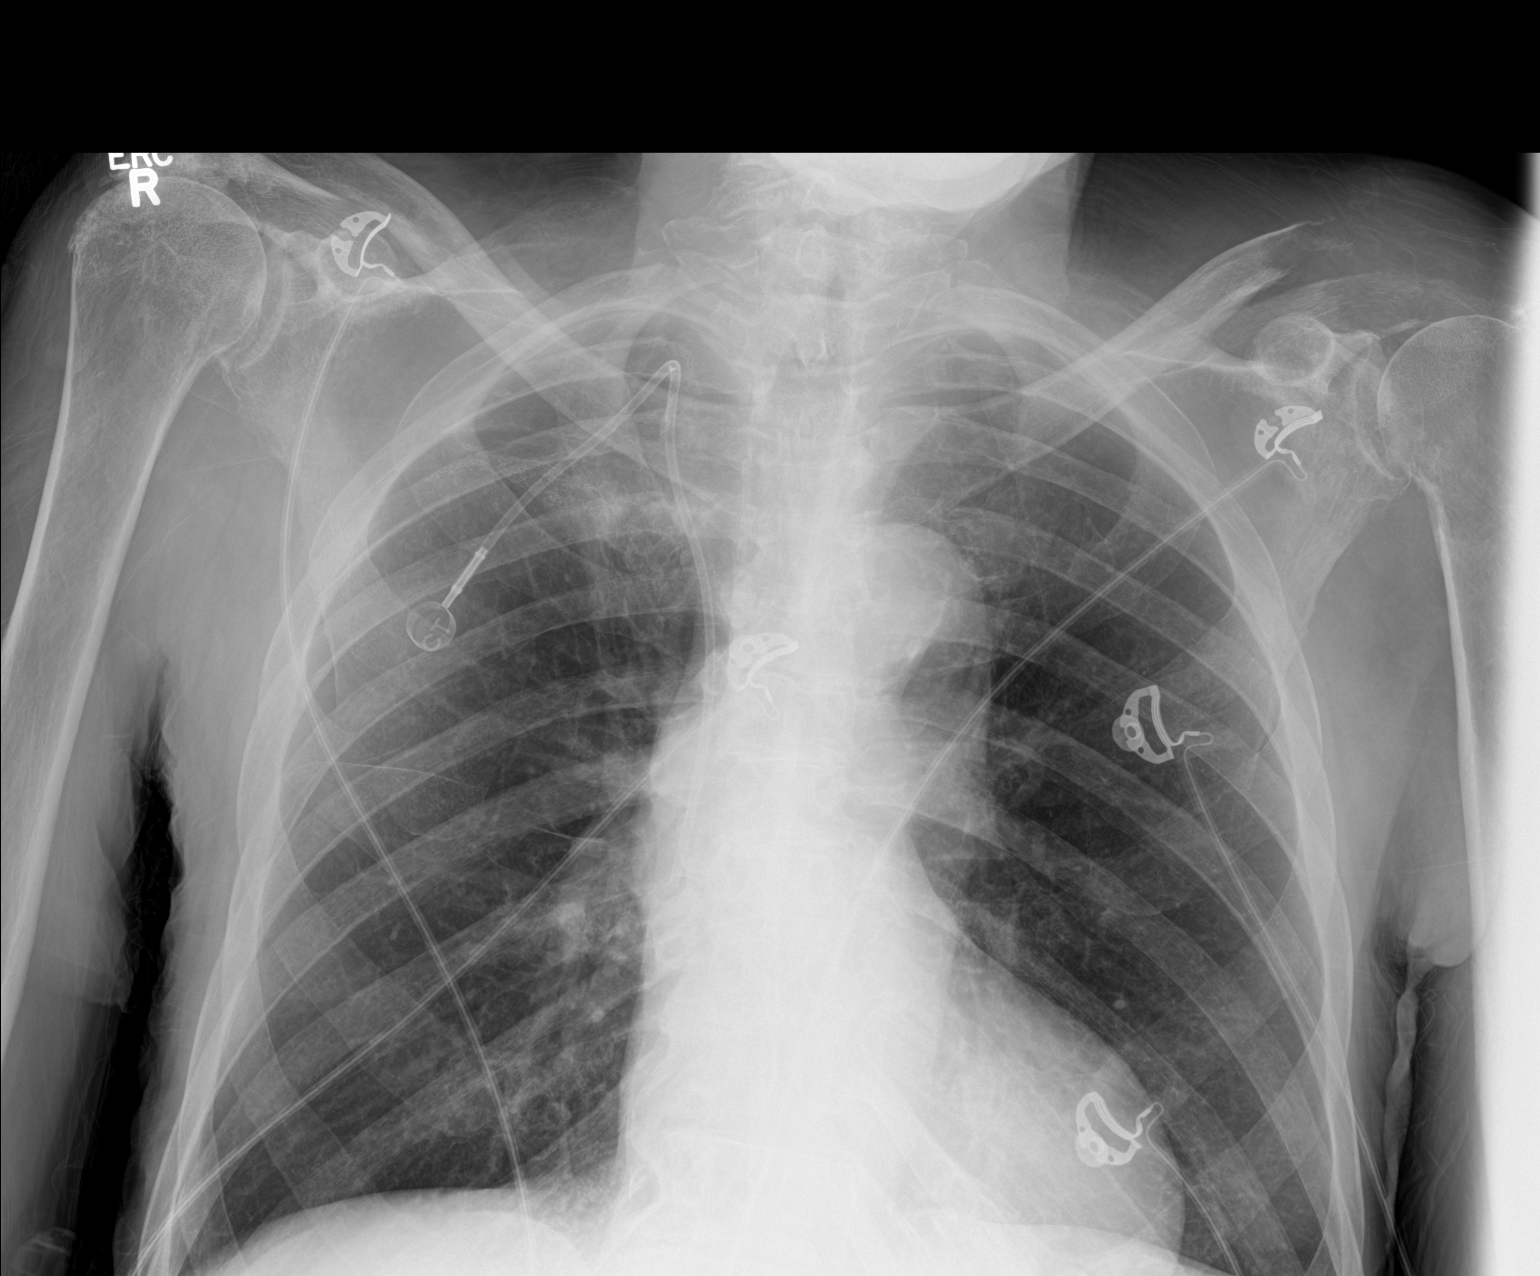

[chest ap (2 of 2)]
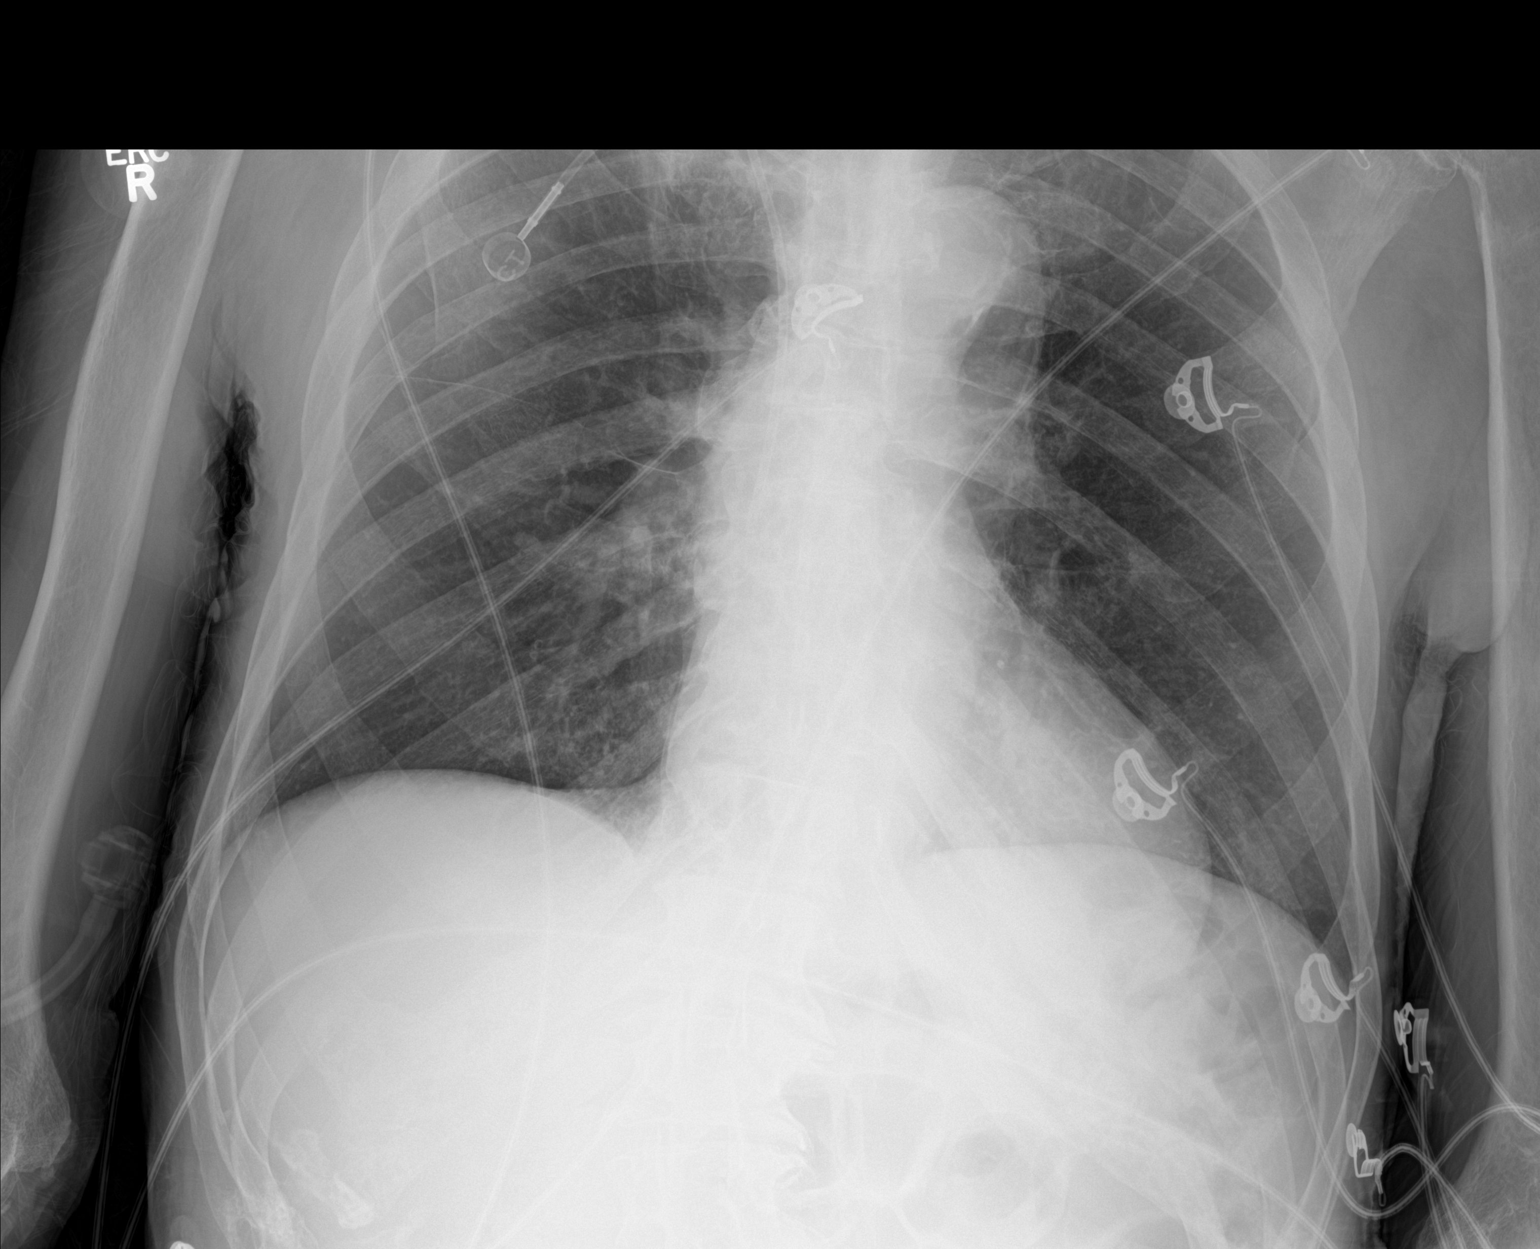

[2 of 2 positions shown; findings below may reference images not displayed]

FINDINGS: The lungs are well-aerated. Postoperative scarring is noted at the
right lung apex. There is no evidence of focal opacification,
pleural effusion or pneumothorax.

The cardiomediastinal silhouette is within normal limits. There is
persistent destruction of the distal left clavicle, similar in
appearance to the recent prior study. Degenerative change is noted
about the right acromion. A right-sided chest port is noted ending
about the distal SVC.
IMPRESSION: 1. Destruction of the distal clavicle, similar in appearance to the
recent prior study, reflecting the patient's known metastatic
carcinoma.
2. Postoperative scarring at the right lung apex. Lungs otherwise
grossly clear.

## 2017-02-27 IMAGING — DX DG CHEST 1V
1 series · 1 of 1 positions shown · non-contrast
Comparison: Chest radiograph and chest CT November 14, 2015

CLINICAL DATA: Aspiration pneumonitis. History of lung carcinoma.
Hypertension.

EXAM:
CHEST 1 VIEW

[chest ap]
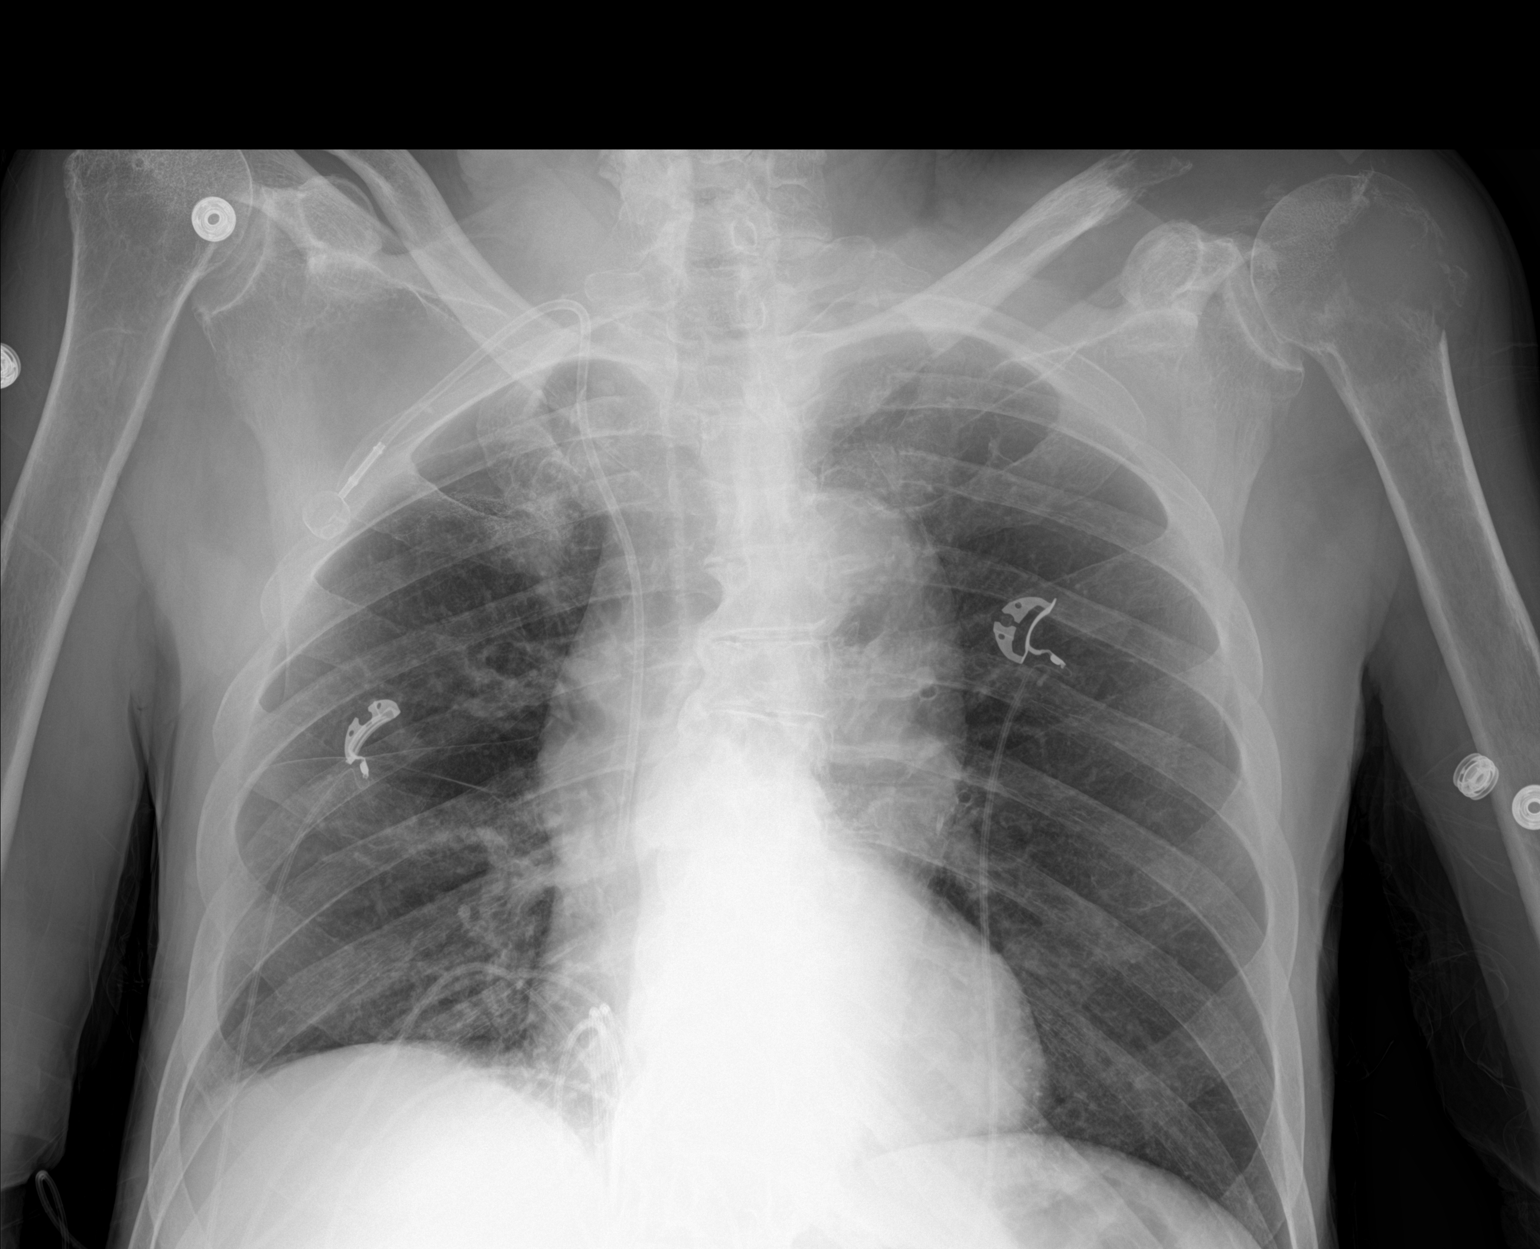

[1 of 1 positions shown; findings below may reference images not displayed]

FINDINGS: Extensive bony destruction is noted in the distal left clavicle and
proximal humeral regions.

There is no edema or consolidation. The heart size and pulmonary
vascular normal. Aorta is prominent with calcification in the arch
region, stable. Port-A-Cath tip is in the superior vena cava. No
pneumothorax. No adenopathy evident. There is arthropathy in the
right shoulder.
IMPRESSION: Widespread bony destruction involving the left distal clavicle and
proximal left humerus consistent with metastatic disease. No
parenchymal lung edema or consolidation. Stable cardiac silhouette.
Prominence of the aorta may reflect chronic hypertensive change.
There is aortic atherosclerosis.
